# Patient Record
Sex: Female | Born: 1965 | Race: White | Hispanic: No | Marital: Married | Smoking: Never smoker
Health system: Southern US, Community
[De-identification: ages and names within clinical notes are randomized; demographics above are authoritative.]

## PROBLEM LIST (undated history)

## (undated) DIAGNOSIS — D649 Anemia, unspecified: Secondary | ICD-10-CM

## (undated) DIAGNOSIS — G4700 Insomnia, unspecified: Secondary | ICD-10-CM

## (undated) DIAGNOSIS — I10 Essential (primary) hypertension: Secondary | ICD-10-CM

## (undated) HISTORY — DX: Insomnia, unspecified: G47.00

## (undated) HISTORY — PX: GUM SURGERY: SHX658

## (undated) HISTORY — PX: WISDOM TOOTH EXTRACTION: SHX21

## (undated) HISTORY — PX: OTHER SURGICAL HISTORY: SHX169

---

## 1997-07-13 ENCOUNTER — Emergency Department (HOSPITAL_COMMUNITY): Admission: EM | Admit: 1997-07-13 | Discharge: 1997-07-13 | Payer: Self-pay | Admitting: Emergency Medicine

## 1997-09-18 ENCOUNTER — Other Ambulatory Visit: Admission: RE | Admit: 1997-09-18 | Discharge: 1997-09-18 | Payer: Self-pay | Admitting: Obstetrics and Gynecology

## 1997-10-20 ENCOUNTER — Other Ambulatory Visit: Admission: RE | Admit: 1997-10-20 | Discharge: 1997-10-20 | Payer: Self-pay | Admitting: *Deleted

## 1997-11-24 ENCOUNTER — Other Ambulatory Visit: Admission: RE | Admit: 1997-11-24 | Discharge: 1997-11-24 | Payer: Self-pay | Admitting: Obstetrics and Gynecology

## 1998-03-02 ENCOUNTER — Ambulatory Visit (HOSPITAL_COMMUNITY): Admission: RE | Admit: 1998-03-02 | Discharge: 1998-03-02 | Payer: Self-pay | Admitting: Obstetrics and Gynecology

## 1998-03-07 ENCOUNTER — Inpatient Hospital Stay (HOSPITAL_COMMUNITY): Admission: AD | Admit: 1998-03-07 | Discharge: 1998-03-07 | Payer: Self-pay | Admitting: Obstetrics & Gynecology

## 1998-03-11 ENCOUNTER — Inpatient Hospital Stay (HOSPITAL_COMMUNITY): Admission: AD | Admit: 1998-03-11 | Discharge: 1998-03-16 | Payer: Self-pay | Admitting: Obstetrics and Gynecology

## 1998-03-15 ENCOUNTER — Encounter (HOSPITAL_COMMUNITY): Admission: RE | Admit: 1998-03-15 | Discharge: 1998-06-13 | Payer: Self-pay | Admitting: Obstetrics and Gynecology

## 1998-04-22 ENCOUNTER — Other Ambulatory Visit: Admission: RE | Admit: 1998-04-22 | Discharge: 1998-04-22 | Payer: Self-pay | Admitting: Obstetrics and Gynecology

## 1998-06-22 ENCOUNTER — Encounter (HOSPITAL_COMMUNITY): Admission: RE | Admit: 1998-06-22 | Discharge: 1998-09-20 | Payer: Self-pay | Admitting: Obstetrics and Gynecology

## 1999-10-14 ENCOUNTER — Other Ambulatory Visit: Admission: RE | Admit: 1999-10-14 | Discharge: 1999-10-14 | Payer: Self-pay | Admitting: Obstetrics and Gynecology

## 2001-03-07 ENCOUNTER — Other Ambulatory Visit: Admission: RE | Admit: 2001-03-07 | Discharge: 2001-03-07 | Payer: Self-pay | Admitting: Obstetrics and Gynecology

## 2003-05-30 ENCOUNTER — Other Ambulatory Visit: Admission: RE | Admit: 2003-05-30 | Discharge: 2003-05-30 | Payer: Self-pay | Admitting: Obstetrics and Gynecology

## 2003-09-10 ENCOUNTER — Ambulatory Visit (HOSPITAL_COMMUNITY): Admission: RE | Admit: 2003-09-10 | Discharge: 2003-09-10 | Payer: Self-pay | Admitting: Internal Medicine

## 2003-10-17 ENCOUNTER — Ambulatory Visit (HOSPITAL_COMMUNITY): Admission: RE | Admit: 2003-10-17 | Discharge: 2003-10-17 | Payer: Self-pay | Admitting: General Surgery

## 2003-10-27 ENCOUNTER — Ambulatory Visit (HOSPITAL_COMMUNITY): Admission: RE | Admit: 2003-10-27 | Discharge: 2003-10-27 | Payer: Self-pay | Admitting: General Surgery

## 2004-04-05 ENCOUNTER — Encounter: Admission: RE | Admit: 2004-04-05 | Discharge: 2004-04-05 | Payer: Self-pay | Admitting: General Surgery

## 2004-06-22 ENCOUNTER — Encounter: Admission: RE | Admit: 2004-06-22 | Discharge: 2004-06-22 | Payer: Self-pay | Admitting: General Surgery

## 2010-01-31 ENCOUNTER — Encounter: Payer: Self-pay | Admitting: General Surgery

## 2013-09-26 ENCOUNTER — Other Ambulatory Visit: Payer: Self-pay | Admitting: Gastroenterology

## 2013-10-14 ENCOUNTER — Other Ambulatory Visit: Payer: Self-pay | Admitting: Obstetrics and Gynecology

## 2013-10-15 LAB — CYTOLOGY - PAP

## 2013-10-16 NOTE — Patient Instructions (Addendum)
   Your procedure is scheduled on:  Monday, Oct 12  Enter through the [employer] [facility] at: [place] up the phone at the desk and dial [phone] and inform us of your arrival.  Please call this number if you have any problems the morning of surgery: [phone]  Remember: Do not eat food after midnight: Sunday Do not drink clear liquids after: 9 AM Monday, day of surgery Take these medicines the morning of surgery with a SIP OF WATER:  Benicar  Do not wear jewelry, make-up, or FINGER nail polish No metal in your hair or on your body. Do not wear lotions, powders, perfumes.  You may wear deodorant.  Do not bring valuables to the hospital. Contacts, dentures or bridgework may not be worn into surgery.  Patients discharged on the day of surgery will not be allowed to drive home.  Home with husband Sonia Side cell [phone]

## 2013-10-17 ENCOUNTER — Encounter (HOSPITAL_COMMUNITY)
Admission: RE | Admit: 2013-10-17 | Discharge: 2013-10-17 | Disposition: A | Discharge status: Home / Self Care | Payer: BC Managed Care – PPO | Source: Ambulatory Visit | Attending: Obstetrics and Gynecology | Admitting: Obstetrics and Gynecology

## 2013-10-17 ENCOUNTER — Encounter (HOSPITAL_COMMUNITY): Payer: Self-pay

## 2013-10-17 DIAGNOSIS — N920 Excessive and frequent menstruation with regular cycle: Secondary | ICD-10-CM | POA: Insufficient documentation

## 2013-10-17 DIAGNOSIS — I10 Essential (primary) hypertension: Secondary | ICD-10-CM | POA: Diagnosis not present

## 2013-10-17 DIAGNOSIS — Z0000 Encounter for general adult medical examination without abnormal findings: Secondary | ICD-10-CM | POA: Diagnosis present

## 2013-10-17 DIAGNOSIS — D649 Anemia, unspecified: Secondary | ICD-10-CM | POA: Insufficient documentation

## 2013-10-17 DIAGNOSIS — N946 Dysmenorrhea, unspecified: Secondary | ICD-10-CM | POA: Diagnosis not present

## 2013-10-17 HISTORY — DX: Anemia, unspecified: D64.9

## 2013-10-17 HISTORY — DX: Essential (primary) hypertension: I10

## 2013-10-17 LAB — COMPREHENSIVE METABOLIC PANEL
ALT: 13 U/L (ref 0–35)
AST: 13 U/L (ref 0–37)
Albumin: 4.1 g/dL (ref 3.5–5.2)
Alkaline Phosphatase: 68 U/L (ref 39–117)
Anion gap: 10 (ref 5–15)
BUN: 20 mg/dL (ref 6–23)
CO2: 24 mEq/L (ref 19–32)
Calcium: 9 mg/dL (ref 8.4–10.5)
Chloride: 103 mEq/L (ref 96–112)
Creatinine, Ser: 0.93 mg/dL (ref 0.50–1.10)
GFR calc Af Amer: 82 mL/min — ABNORMAL LOW (ref >90)
GFR calc non Af Amer: 70 mL/min — ABNORMAL LOW (ref >90)
Glucose, Bld: 86 mg/dL (ref 70–99)
Potassium: 4.8 mEq/L (ref 3.7–5.3)
Sodium: 137 mEq/L (ref 137–147)
Total Bilirubin: 0.6 mg/dL (ref 0.3–1.2)
Total Protein: 7.5 g/dL (ref 6.0–8.3)

## 2013-10-17 LAB — CBC
HCT: 32.1 % — ABNORMAL LOW (ref 36.0–46.0)
Hemoglobin: 9.9 g/dL — ABNORMAL LOW (ref 12.0–15.0)
MCH: 23.5 pg — ABNORMAL LOW (ref 26.0–34.0)
MCHC: 30.8 g/dL (ref 30.0–36.0)
MCV: 76.1 fL — ABNORMAL LOW (ref 78.0–100.0)
Platelets: 329 10*3/uL (ref 150–400)
RBC: 4.22 MIL/uL (ref 3.87–5.11)
RDW: 17.3 % — ABNORMAL HIGH (ref 11.5–15.5)
WBC: 5.6 10*3/uL (ref 4.0–10.5)

## 2013-10-17 NOTE — Progress Notes (Signed)
[facility]  DICTATION # [id number] CSN# [id number]   Margarette Asal, MD 10/17/2013 11:24 AM

## 2013-10-18 NOTE — H&P (Signed)
Adrienne Hayes, BOCH                  ACCOUNT NO.:  [id number]  MEDICAL RECORD NO.:  [id number]  LOCATION:                                 FACILITY:  PHYSICIAN:  Ralene Bathe. Matthew Saras, M.D.    DATE OF BIRTH:  DATE OF ADMISSION:  10/21/2013 DATE OF DISCHARGE:                             HISTORY & PHYSICAL   CHIEF COMPLAINT:  Dysmenorrhea, menorrhagia with anemia.  HISTORY OF PRESENT ILLNESS:  A 48 year old G1, P1 who has problems with continued menorrhagia and dysmenorrhea along with fatigue.  Several years ago when she had similar complaints, we performed sonohysterogram in the office that was dated 12/13.  At that time, adnexa were unremarkable, cul-de-sac was normal.  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  On saline infusion, there appeared to be a polyp in the right cavity with the left side showing some irregularity.  We had recommended D and C, hysteroscopy at that time.  Due to insurance issues, she declined.  She now has insurance coverage.  These symptoms have persisted and she presents now for D and C, hysteroscopy with Truclear resection of the polyp.  This procedure including specific risks related to bleeding, infection, other complications such as perforation, and may require additional surgery reviewed with her which she understands and accepts.  PAST MEDICAL HISTORY: 1. Allergies:  CT dye. 2. Surgeries:  She has had 1 C-section in 2000.  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  She is currently on Benicar.  Dr. Bevelyn Buckles is her medical doctor.  FAMILY HISTORY:  Significant for headache, heart disease, ulcer disease, TB, kidney disease, UTI, osteoporosis, diverticulosis, arthritis, phlebitis, hypertension, and melanoma.  Last Pap in October 2015 returned normal.  PHYSICAL EXAMINATION:  VITAL SIGNS:  Temp 98.2, blood pressure 120/78. HEENT:  Unremarkable. NECK:  Supple, without masses. LUNGS:  Clear. CARDIOVASCULAR:  Regular rate and  rhythm without murmurs, rubs, gallops noted. BREASTS:  Without masses. ABDOMEN:  Soft, flat, and nontender. PELVIC:  Vulva, vagina, cervix normal.  Uterus was upper limit of normal size.  Adnexa negative. EXTREMITIES:  Unremarkable. NEUROLOGIC:  Unremarkable.  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  PLAN:  D and C, hysteroscopy with Truclear resection.  Procedure and risks discussed as above.     Quintana Canelo M. Matthew Saras, M.D.     [facility]MED[facility]  D:  10/17/2013  T:  10/17/2013  Job:  [id number]

## 2013-10-21 ENCOUNTER — Ambulatory Visit (HOSPITAL_COMMUNITY): Payer: BC Managed Care – PPO | Admitting: Anesthesiology

## 2013-10-21 ENCOUNTER — Encounter (HOSPITAL_COMMUNITY): Payer: BC Managed Care – PPO | Admitting: Anesthesiology

## 2013-10-21 ENCOUNTER — Ambulatory Visit (HOSPITAL_COMMUNITY)
Admission: RE | Admit: 2013-10-21 | Discharge: 2013-10-21 | Disposition: A | Discharge status: Home / Self Care | Payer: BC Managed Care – PPO | Source: Ambulatory Visit | Attending: Obstetrics and Gynecology | Admitting: Obstetrics and Gynecology

## 2013-10-21 ENCOUNTER — Encounter (HOSPITAL_COMMUNITY): Payer: Self-pay | Admitting: Anesthesiology

## 2013-10-21 ENCOUNTER — Encounter (HOSPITAL_COMMUNITY)
Admission: RE | Disposition: A | Discharge status: Home / Self Care | Payer: Self-pay | Source: Ambulatory Visit | Attending: Obstetrics and Gynecology

## 2013-10-21 DIAGNOSIS — D649 Anemia, unspecified: Secondary | ICD-10-CM | POA: Diagnosis not present

## 2013-10-21 DIAGNOSIS — N840 Polyp of corpus uteri: Secondary | ICD-10-CM | POA: Insufficient documentation

## 2013-10-21 DIAGNOSIS — N946 Dysmenorrhea, unspecified: Secondary | ICD-10-CM | POA: Diagnosis present

## 2013-10-21 DIAGNOSIS — Q51818 Other congenital malformations of uterus: Secondary | ICD-10-CM | POA: Insufficient documentation

## 2013-10-21 DIAGNOSIS — D259 Leiomyoma of uterus, unspecified: Secondary | ICD-10-CM | POA: Diagnosis not present

## 2013-10-21 DIAGNOSIS — I10 Essential (primary) hypertension: Secondary | ICD-10-CM | POA: Insufficient documentation

## 2013-10-21 HISTORY — PX: DILATATION & CURETTAGE/HYSTEROSCOPY WITH TRUECLEAR: SHX6353

## 2013-10-21 LAB — PREGNANCY, URINE: Preg Test, Ur: NEGATIVE

## 2013-10-21 SURGERY — DILATATION & CURETTAGE/HYSTEROSCOPY WITH TRUCLEAR
Anesthesia: General

## 2013-10-21 MED ORDER — SCOPOLAMINE 1 MG/3DAYS TD PT72
1.0000 | MEDICATED_PATCH | Freq: Once | TRANSDERMAL | Status: DC
  Administered 2013-10-21: 1.5 mg via TRANSDERMAL

## 2013-10-21 MED ORDER — FENTANYL CITRATE 0.05 MG/ML IJ SOLN
INTRAMUSCULAR | Status: DC | PRN
  Administered 2013-10-21 (×2): 50 ug via INTRAVENOUS

## 2013-10-21 MED ORDER — MIDAZOLAM HCL 2 MG/2ML IJ SOLN
INTRAMUSCULAR | Status: AC
  Filled 2013-10-21: qty 2

## 2013-10-21 MED ORDER — PHENYLEPHRINE 40 MCG/ML (10ML) SYRINGE FOR IV PUSH (FOR BLOOD PRESSURE SUPPORT)
PREFILLED_SYRINGE | INTRAVENOUS | Status: AC
  Filled 2013-10-21: qty 5

## 2013-10-21 MED ORDER — LIDOCAINE HCL (CARDIAC) 20 MG/ML IV SOLN
INTRAVENOUS | Status: AC
  Filled 2013-10-21: qty 5

## 2013-10-21 MED ORDER — PROPOFOL 10 MG/ML IV BOLUS
INTRAVENOUS | Status: DC | PRN
  Administered 2013-10-21: 150 mg via INTRAVENOUS

## 2013-10-21 MED ORDER — IBUPROFEN 200 MG PO TABS: 800.0000 mg | ORAL_TABLET | Freq: Three times a day (TID) | ORAL | Status: DC | PRN

## 2013-10-21 MED ORDER — KETOROLAC TROMETHAMINE 30 MG/ML IJ SOLN: 15.0000 mg | Freq: Once | INTRAMUSCULAR | Status: DC | PRN

## 2013-10-21 MED ORDER — KETOROLAC TROMETHAMINE 30 MG/ML IJ SOLN
INTRAMUSCULAR | Status: DC | PRN
  Administered 2013-10-21: 30 mg via INTRAVENOUS

## 2013-10-21 MED ORDER — OXYCODONE-ACETAMINOPHEN 2.5-325 MG PO TABS: 1.0000 | ORAL_TABLET | ORAL | Status: DC | PRN

## 2013-10-21 MED ORDER — SCOPOLAMINE 1 MG/3DAYS TD PT72
MEDICATED_PATCH | TRANSDERMAL | Status: AC
  Filled 2013-10-21: qty 1

## 2013-10-21 MED ORDER — DEXAMETHASONE SODIUM PHOSPHATE 10 MG/ML IJ SOLN
INTRAMUSCULAR | Status: DC | PRN
  Administered 2013-10-21: 4 mg via INTRAVENOUS

## 2013-10-21 MED ORDER — PROMETHAZINE HCL 25 MG/ML IJ SOLN: 6.2500 mg | INTRAMUSCULAR | Status: DC | PRN

## 2013-10-21 MED ORDER — PHENYLEPHRINE HCL 10 MG/ML IJ SOLN
INTRAMUSCULAR | Status: DC | PRN
  Administered 2013-10-21: 80 ug via INTRAVENOUS
  Administered 2013-10-21: 40 ug via INTRAVENOUS
  Administered 2013-10-21 (×2): 80 ug via INTRAVENOUS

## 2013-10-21 MED ORDER — FENTANYL CITRATE 0.05 MG/ML IJ SOLN
INTRAMUSCULAR | Status: AC
  Filled 2013-10-21: qty 2

## 2013-10-21 MED ORDER — DEXAMETHASONE SODIUM PHOSPHATE 4 MG/ML IJ SOLN
INTRAMUSCULAR | Status: AC
  Filled 2013-10-21: qty 1

## 2013-10-21 MED ORDER — ONDANSETRON HCL 4 MG/2ML IJ SOLN
INTRAMUSCULAR | Status: DC | PRN
  Administered 2013-10-21: 4 mg via INTRAVENOUS

## 2013-10-21 MED ORDER — MIDAZOLAM HCL 2 MG/2ML IJ SOLN
INTRAMUSCULAR | Status: DC | PRN
  Administered 2013-10-21: 2 mg via INTRAVENOUS

## 2013-10-21 MED ORDER — LIDOCAINE HCL 1 % IJ SOLN
INTRAMUSCULAR | Status: DC | PRN
  Administered 2013-10-21: 8.5 mL

## 2013-10-21 MED ORDER — ONDANSETRON HCL 4 MG/2ML IJ SOLN
INTRAMUSCULAR | Status: AC
  Filled 2013-10-21: qty 2

## 2013-10-21 MED ORDER — PROPOFOL 10 MG/ML IV EMUL
INTRAVENOUS | Status: AC
  Filled 2013-10-21: qty 20

## 2013-10-21 MED ORDER — KETOROLAC TROMETHAMINE 30 MG/ML IJ SOLN
INTRAMUSCULAR | Status: AC
  Filled 2013-10-21: qty 1

## 2013-10-21 MED ORDER — SODIUM CHLORIDE 0.9 % IR SOLN
Status: DC | PRN
Start: 2013-10-21 — End: 2013-10-21
  Administered 2013-10-21: 3000 mL

## 2013-10-21 MED ORDER — FENTANYL CITRATE 0.05 MG/ML IJ SOLN
25.0000 ug | INTRAMUSCULAR | Status: DC | PRN
  Administered 2013-10-21: 50 ug via INTRAVENOUS

## 2013-10-21 MED ORDER — LIDOCAINE HCL (CARDIAC) 20 MG/ML IV SOLN
INTRAVENOUS | Status: DC | PRN
  Administered 2013-10-21: 50 mg via INTRAVENOUS

## 2013-10-21 MED ORDER — MIDAZOLAM HCL 2 MG/2ML IJ SOLN: 0.5000 mg | Freq: Once | INTRAMUSCULAR | Status: DC | PRN

## 2013-10-21 MED ORDER — MEPERIDINE HCL 25 MG/ML IJ SOLN: 6.2500 mg | INTRAMUSCULAR | Status: DC | PRN

## 2013-10-21 MED ORDER — LACTATED RINGERS IV SOLN
INTRAVENOUS | Status: DC
  Administered 2013-10-21 (×2): via INTRAVENOUS

## 2013-10-21 MED ORDER — LIDOCAINE HCL 1 % IJ SOLN
INTRAMUSCULAR | Status: AC
  Filled 2013-10-21: qty 20

## 2013-10-21 SURGICAL SUPPLY — 15 items
BLADE INCISOR TRUC PLUS 2.9 (ABLATOR) ×1 IMPLANT
CANISTERS HI-FLOW 3000CC (CANNISTER) ×6 IMPLANT
CATH ROBINSON RED A/P 16FR (CATHETERS) ×3 IMPLANT
CLOTH BEACON ORANGE TIMEOUT ST (SAFETY) ×3 IMPLANT
CONTAINER PREFILL 10% NBF 60ML (FORM) ×3 IMPLANT
DRAPE HYSTEROSCOPY (DRAPE) ×3 IMPLANT
GLOVE BIO SURGEON STRL SZ7 (GLOVE) ×6 IMPLANT
GOWN STRL REUS W/TWL LRG LVL3 (GOWN DISPOSABLE) ×6 IMPLANT
INCISOR TRUC PLUS BLADE 2.9 (ABLATOR) ×3
KIT HYSTEROSCOPY TRUCLEAR (ABLATOR) IMPLANT
MORCELLATOR RECIP TRUCLEAR 4.0 (ABLATOR) IMPLANT
PACK VAGINAL MINOR WOMEN LF (CUSTOM PROCEDURE TRAY) ×3 IMPLANT
PAD OB MATERNITY 4.3X12.25 (PERSONAL CARE ITEMS) ×3 IMPLANT
TOWEL OR 17X24 6PK STRL BLUE (TOWEL DISPOSABLE) ×6 IMPLANT
WATER STERILE IRR 1000ML POUR (IV SOLUTION) ×3 IMPLANT

## 2013-10-21 NOTE — Transfer of Care (Signed)
Immediate Anesthesia Transfer of Care Note  Patient: Adrienne Hayes  Procedure(s) Performed: Procedure(s): DILATATION & CURETTAGE/HYSTEROSCOPY WITH TRUCLEAR (N/A)  Patient Location: PACU  Anesthesia Type:General  Level of Consciousness: awake, alert  and oriented  Airway & Oxygen Therapy: Patient Spontanous Breathing and Patient connected to nasal cannula oxygen  Post-op Assessment: Report given to PACU RN and Post -op Vital signs reviewed and stable  Post vital signs: Reviewed and stable  Complications: No apparent anesthesia complications

## 2013-10-21 NOTE — Op Note (Signed)
Preoperative diagnosis: Abnormal uterine bleeding, endometrial polyp, congenital uterine malformation, communicating rudimentary left uterine horn  Postoperative diagnosis: Same  Procedure: D&C hysteroscopy with true clear resection of endometrial polyp, right uterine horn  Surgeon: Matthew Saras A Anesthesia: Gen.  Specimens removed:, Endometrial curettings, resected polyp fibroids, all to pathology.    Complications: None  EBL: Less than 50 cc:  The patient taken the operating room after an adequate level of general anesthesia was obtained with the legs in stirrups the perineum and vagina prepped and draped in the usual fashion for D&C, bladder was drained, EUA was carried out uterus was upper limit normal size mid position, adnexa negative. Appropriate timeout taken at that point.  Speculum was positioned, cervix grasped with tenaculum paracervical block was then created by infiltrating at 3 and 9:00 submucosally 5-7 cc of 1% plain Xylocaine at each site after negative aspiration. The uterus is then sounded to 9 cm, progressively dilated to a 27 Pratt dilator, continuous flow hysteroscope was inserted and a communicating rudimentary right are left uterine horn could be seen although the opening was small. The main cavity was on the right scope was inserted into the fundus there were several polypoid areas were noted these were resected with the true clear resector down to the level of the surrounding endometrium. Once this was completed, the scope was removed sharp curettage was carried out minimal tissue removed, the scope was reinserted the cavity was noted to be clean. She tolerated this well went to recovery room in good condition.  Dictated with [facility]  Adrienne Hayes M. Garry Heater.D.

## 2013-10-21 NOTE — Discharge Instructions (Signed)
DISCHARGE INSTRUCTIONS: HYSTEROSCOPY / ENDOMETRIAL ABLATION °The following instructions have been prepared to help you care for yourself upon your return home. ° °Personal hygiene: °• Use sanitary pads for vaginal drainage, not tampons. °• Shower the day after your procedure. °• NO tub baths, pools or Jacuzzis for 2-3 weeks. °• Wipe front to back after using the bathroom. ° °Activity and limitations: °• Do NOT drive or operate any equipment for 24 hours. The effects of anesthesia are still present °and drowsiness may result. °• Do NOT rest in bed all day. °• Walking is encouraged. °• Walk up and down stairs slowly. °• You may resume your normal activity in one to two days or as indicated by your physician. °Sexual activity: NO intercourse for at least 2 weeks after the procedure, or as indicated by your °Doctor. ° °Diet: Eat a light meal as desired this evening. You may resume your usual diet tomorrow. ° °Return to Work: You may resume your work activities in one to two days or as indicated by your °Doctor. ° °What to expect after your surgery: Expect to have vaginal bleeding/discharge for 2-3 days and °spotting for up to 10 days. It is not unusual to have soreness for up to 1-2 weeks. You may have a °slight burning sensation when you urinate for the first day. Mild cramps may continue for a couple of °days. You may have a regular period in 2-6 weeks. ° °NO IBUPROFEN PRODUCTS (MOTRIN, ADVIL) OR ALEVE UNTIL 7:30PM TODAY. ° ° °Call your doctor for any of the following: °• Excessive vaginal bleeding or clotting, saturating and changing one pad every hour. °• Inability to urinate 6 hours after discharge from hospital. °• Pain not relieved by pain medication. °• Fever of 100.4° F or greater. °• Unusual vaginal discharge or odor. ° °Return to office _________________Call for an appointment ___________________ °Patient’s signature: ______________________ °Nurse’s signature ________________________ ° °Post Anesthesia Care  Unit [phone] °

## 2013-10-21 NOTE — Anesthesia Preprocedure Evaluation (Signed)
Anesthesia Evaluation  Patient identified by MRN, date of birth, ID band Patient awake    Reviewed: Allergy & Precautions, H&P , Patient's Chart, lab work & pertinent test results, reviewed documented beta blocker date and time   History of Anesthesia Complications Negative for: history of anesthetic complications  Airway Mallampati: II TM Distance: >3 FB Neck ROM: full    Dental   Pulmonary  breath sounds clear to auscultation        Cardiovascular Exercise Tolerance: Good hypertension, Rhythm:regular Rate:Normal     Neuro/Psych negative psych ROS   GI/Hepatic   Endo/Other    Renal/GU      Musculoskeletal   Abdominal   Peds  Hematology  (+) anemia ,   Anesthesia Other Findings   Reproductive/Obstetrics                           Anesthesia Physical Anesthesia Plan  ASA: II  Anesthesia Plan: General LMA   Post-op Pain Management:    Induction:   Airway Management Planned:   Additional Equipment:   Intra-op Plan:   Post-operative Plan:   Informed Consent: I have reviewed the patients History and Physical, chart, labs and discussed the procedure including the risks, benefits and alternatives for the proposed anesthesia with the patient or authorized representative who has indicated his/her understanding and acceptance.   Dental Advisory Given  Plan Discussed with: CRNA, Surgeon and Anesthesiologist  Anesthesia Plan Comments:         Anesthesia Quick Evaluation

## 2013-10-21 NOTE — Anesthesia Postprocedure Evaluation (Signed)
Anesthesia Post Note  Patient: Adrienne Hayes  Procedure(s) Performed: Procedure(s) (LRB): DILATATION & CURETTAGE/HYSTEROSCOPY WITH TRUCLEAR (N/A)  Anesthesia type: GA  Patient location: PACU  Post pain: Pain level controlled  Post assessment: Post-op Vital signs reviewed  Last Vitals:  Filed Vitals:   10/21/13 1347  BP:   Pulse:   Temp: 36.4 C  Resp: 16    Post vital signs: Reviewed  Level of consciousness: sedated  Complications: No apparent anesthesia complications

## 2013-10-21 NOTE — Progress Notes (Signed)
The patient was re-examined with no change in status 

## 2013-10-22 ENCOUNTER — Encounter (HOSPITAL_COMMUNITY): Payer: Self-pay | Admitting: Obstetrics and Gynecology

## 2013-10-25 ENCOUNTER — Other Ambulatory Visit: Payer: Self-pay | Admitting: Obstetrics and Gynecology

## 2013-10-25 DIAGNOSIS — R928 Other abnormal and inconclusive findings on diagnostic imaging of breast: Secondary | ICD-10-CM

## 2013-11-06 ENCOUNTER — Ambulatory Visit
Admission: RE | Admit: 2013-11-06 | Discharge: 2013-11-06 | Disposition: A | Discharge status: Home / Self Care | Payer: BC Managed Care – PPO | Source: Ambulatory Visit | Attending: Obstetrics and Gynecology | Admitting: Obstetrics and Gynecology

## 2013-11-06 ENCOUNTER — Other Ambulatory Visit: Payer: Self-pay | Admitting: Obstetrics and Gynecology

## 2013-11-06 DIAGNOSIS — R928 Other abnormal and inconclusive findings on diagnostic imaging of breast: Secondary | ICD-10-CM

## 2013-11-13 ENCOUNTER — Ambulatory Visit
Admission: RE | Admit: 2013-11-13 | Discharge: 2013-11-13 | Disposition: A | Discharge status: Home / Self Care | Payer: BC Managed Care – PPO | Source: Ambulatory Visit | Attending: Obstetrics and Gynecology | Admitting: Obstetrics and Gynecology

## 2013-11-13 DIAGNOSIS — R928 Other abnormal and inconclusive findings on diagnostic imaging of breast: Secondary | ICD-10-CM

## 2014-01-02 ENCOUNTER — Telehealth: Payer: Self-pay | Admitting: Hematology

## 2014-01-02 NOTE — Telephone Encounter (Signed)
S/W PT IN REF TO NP APPT. ON 01/16/14@2 :30 REFERRING DR SCHOOLER DX- IRON DEFICIENCY ANEMIA

## 2014-01-13 ENCOUNTER — Encounter: Payer: Self-pay | Admitting: Hematology

## 2014-01-13 ENCOUNTER — Telehealth: Payer: Self-pay | Admitting: *Deleted

## 2014-01-13 NOTE — Telephone Encounter (Signed)
Received call from pt stating that she would like to know how imperative it is for her to come to appt 01/16/13 & could it be postponed.  She reports that her mother is with hospice & she has a lot going on right now.  She can be reached at [phone]

## 2014-01-14 ENCOUNTER — Telehealth: Payer: Self-pay | Admitting: Hematology

## 2014-01-14 NOTE — Telephone Encounter (Signed)
left message for patient to return to r/s np appt.

## 2014-01-16 ENCOUNTER — Ambulatory Visit: Payer: BC Managed Care – PPO

## 2014-01-16 ENCOUNTER — Ambulatory Visit: Payer: BC Managed Care – PPO | Admitting: Hematology

## 2014-01-16 ENCOUNTER — Other Ambulatory Visit: Payer: BC Managed Care – PPO

## 2014-02-18 ENCOUNTER — Other Ambulatory Visit: Payer: Self-pay

## 2014-02-18 ENCOUNTER — Ambulatory Visit: Payer: Self-pay

## 2014-02-18 ENCOUNTER — Encounter: Payer: Self-pay | Admitting: Hematology

## 2014-02-22 NOTE — Progress Notes (Signed)
This encounter was created in error - please disregard.

## 2014-11-15 IMAGING — MG MM DIAGNOSTIC UNILATERAL L
2 series · 2 of 2 positions shown · non-contrast
Comparison: Priors

CLINICAL DATA: Screening callback for left upper outer quadrant
calcifications

EXAM:
DIGITAL DIAGNOSTIC  left MAMMOGRAM

[L CC]
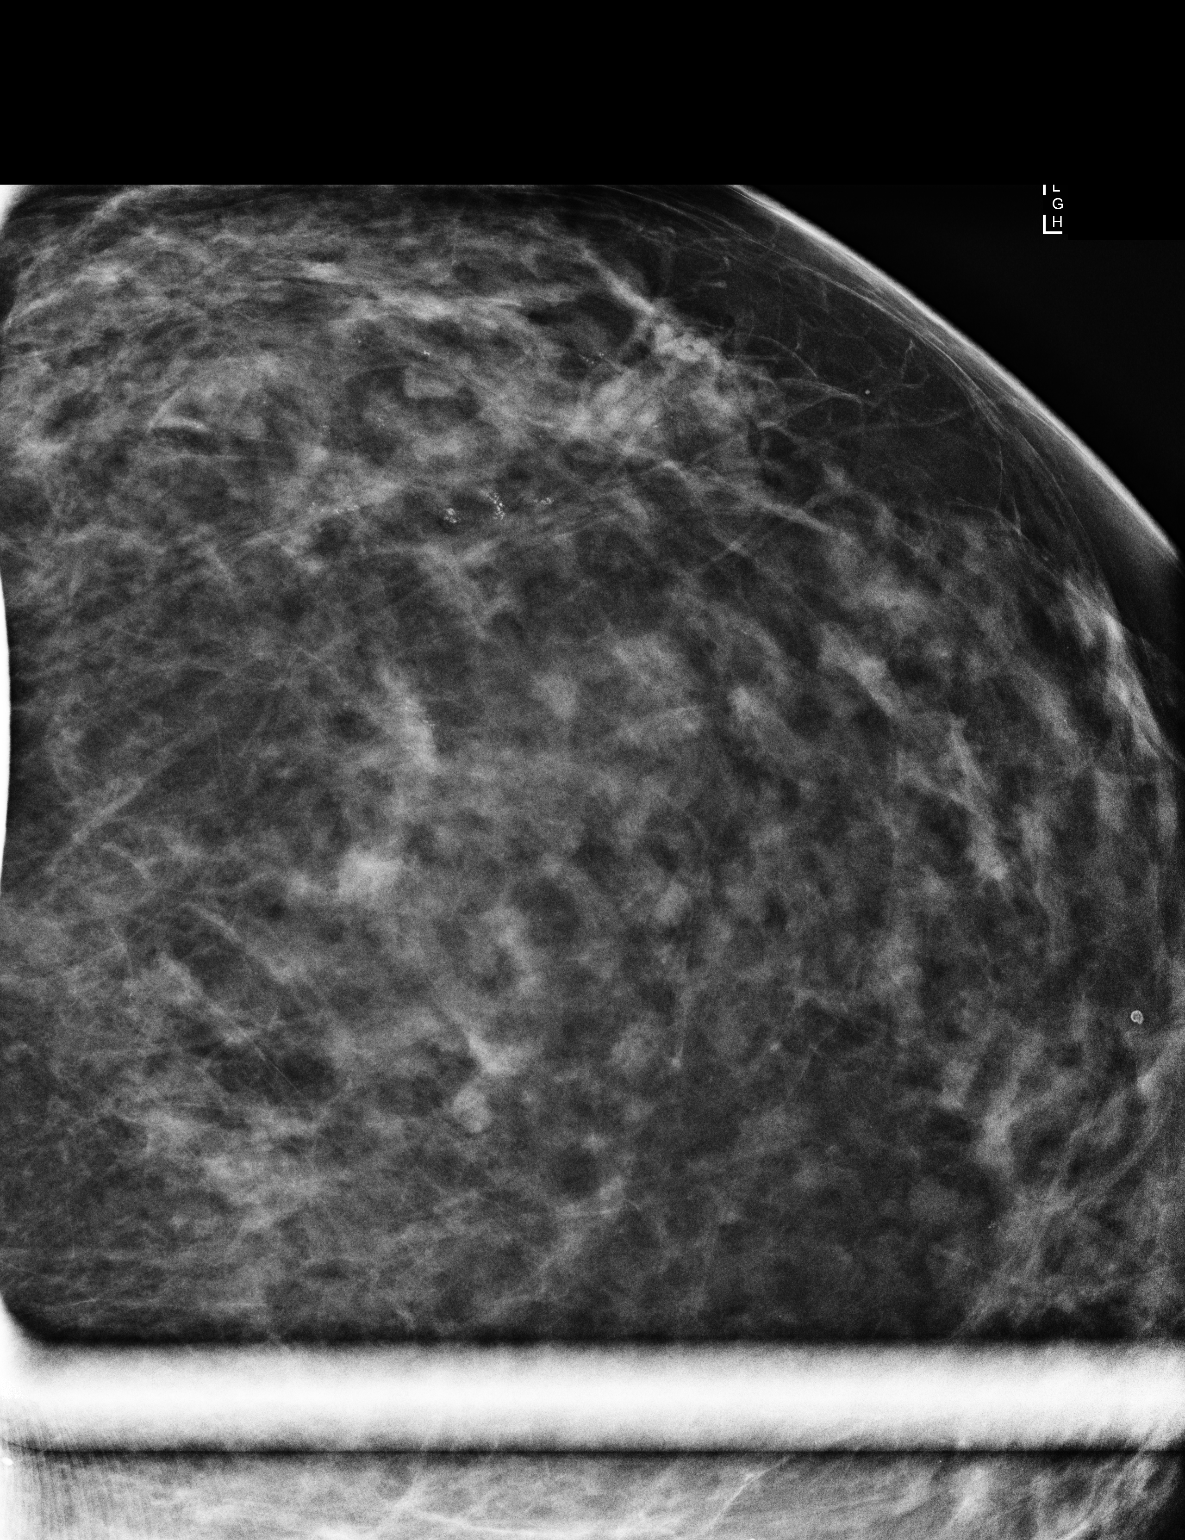

[L ML]
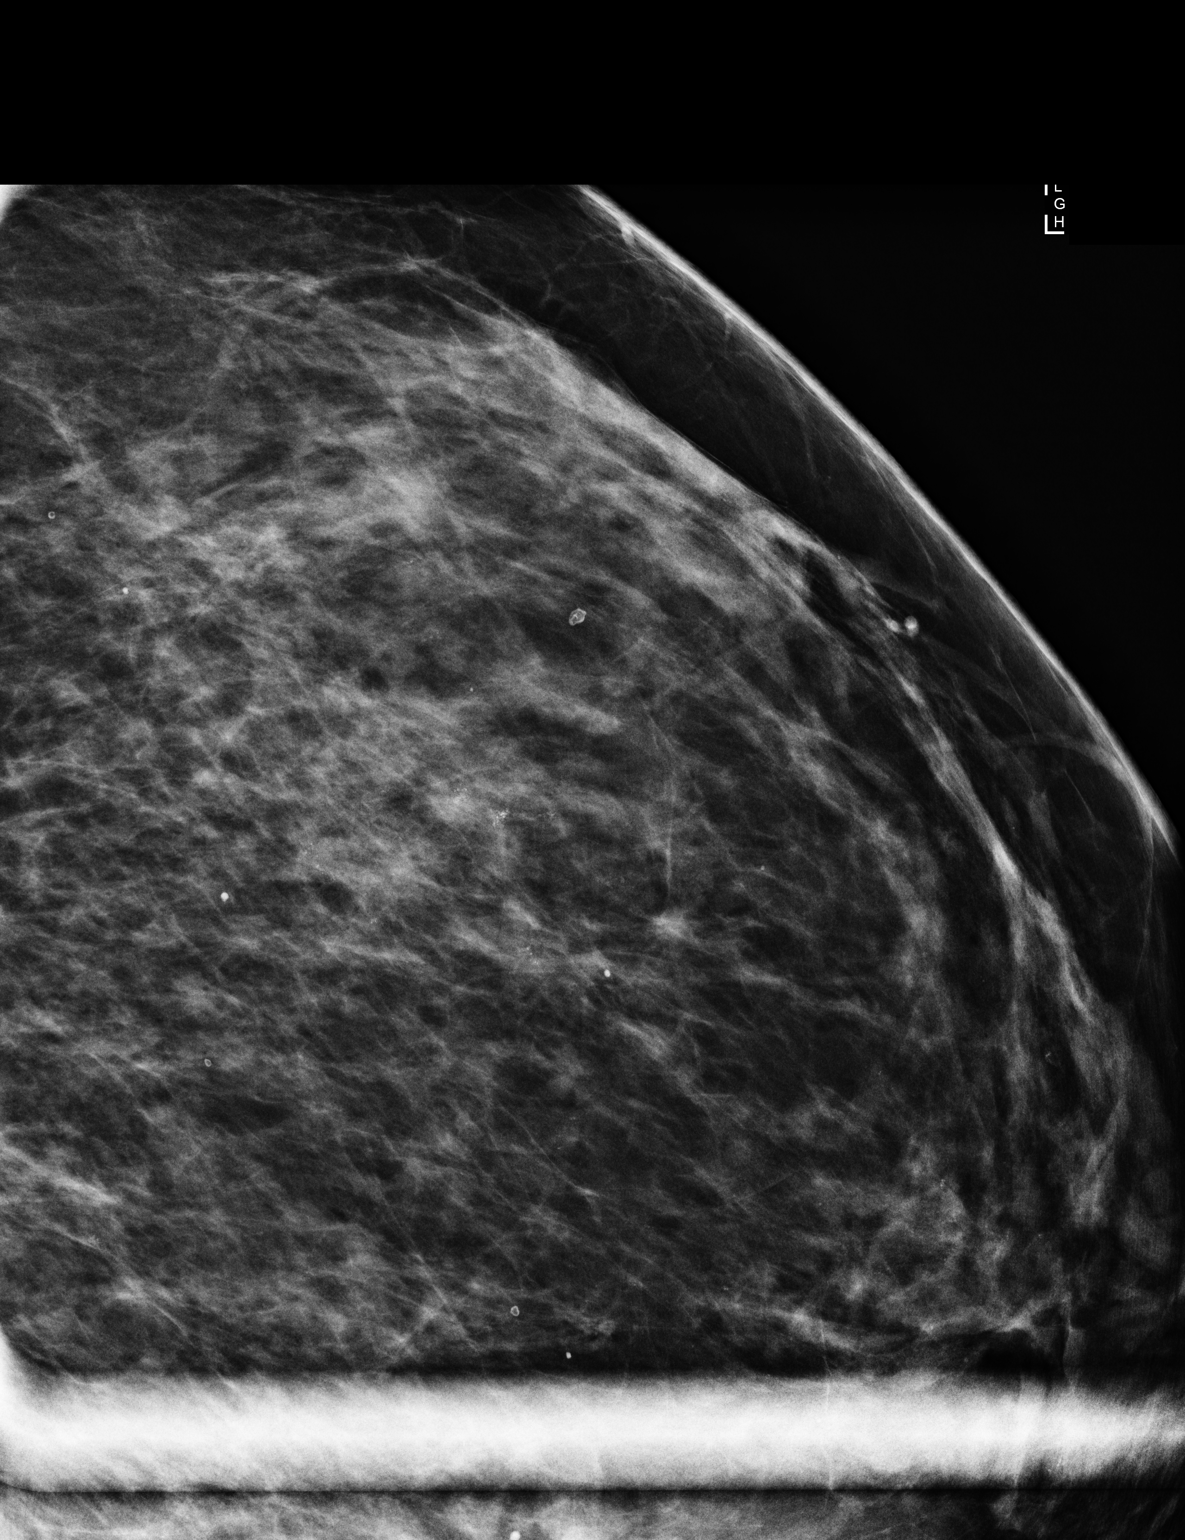

[2 of 2 positions shown; findings below may reference images not displayed]

ACR Breast Density Category c: The breast tissue is heterogeneously
dense, which may obscure small masses.
FINDINGS: Additional views confirm the presence of a 2 cm segmental
distribution of coarse heterogeneous calcifications in the left
upper outer quadrant, corresponding to the screening mammographic
finding.
IMPRESSION: Suspicious left upper outer quadrant calcifications. Stereotactic
core needle biopsy will be scheduled at the patient's convenience.

RECOMMENDATION:
Left stereotactic core needle biopsy

I have discussed the findings and recommendations with the patient.
Results were also provided in writing at the conclusion of the
visit. If applicable, a reminder letter will be sent to the patient
regarding the next appointment.

BI-RADS CATEGORY  4: Suspicious.

## 2014-11-22 IMAGING — MG MM BREAST BX W LOC DEV 1ST LESION IMG BX SPEC STEREO GUIDE L
3 series · 3 of 3 positions shown · non-contrast
Comparison: Previous exams.

ADDENDUM:
Pathology revealed fibrocystic changes with calcifications in the
left breast. This was found to be concordant by Dr. Mousstapha Elf.
Pathology was discussed with the patient by telephone. She reported
doing well after the biopsy with minimal tenderness at the site.
Post biopsy instructions were reviewed and her questions were
answered. She was encouraged to call [REDACTED] for any additional concerns. She was asked to return to

Pathology results reported by Hayron Tiziana RN, BSN on November 14, 2013.
CLINICAL DATA: Coarse calcifications in a segmental distribution in
the upper outer left breast at recent mammography.
EXAM:
LEFT BREAST STEREOTACTIC CORE NEEDLE BIOPSY

[L CC]
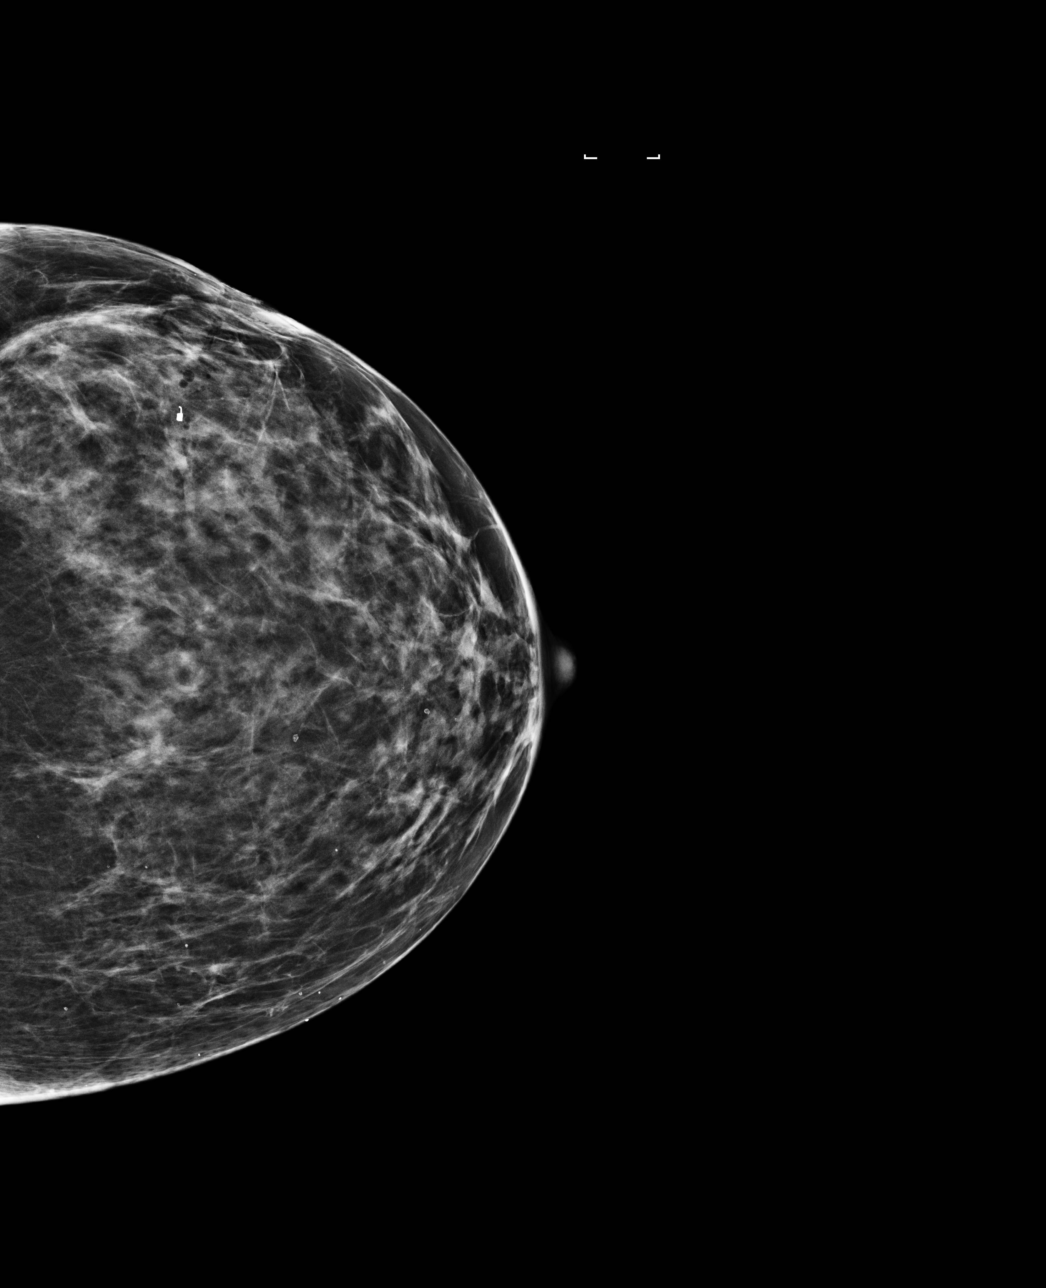

[L SPECIMEN]
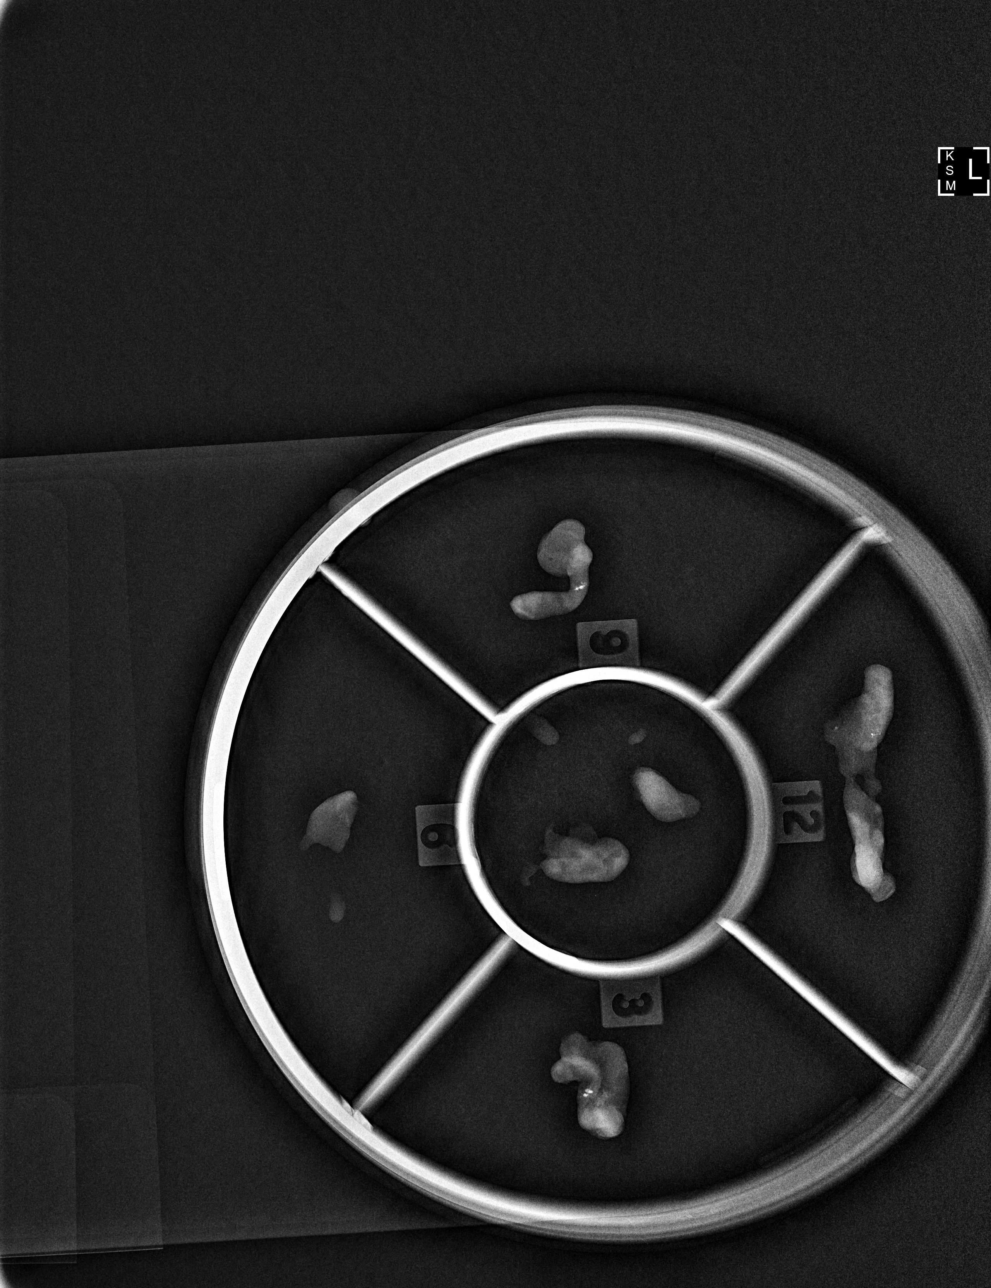

[L ML]
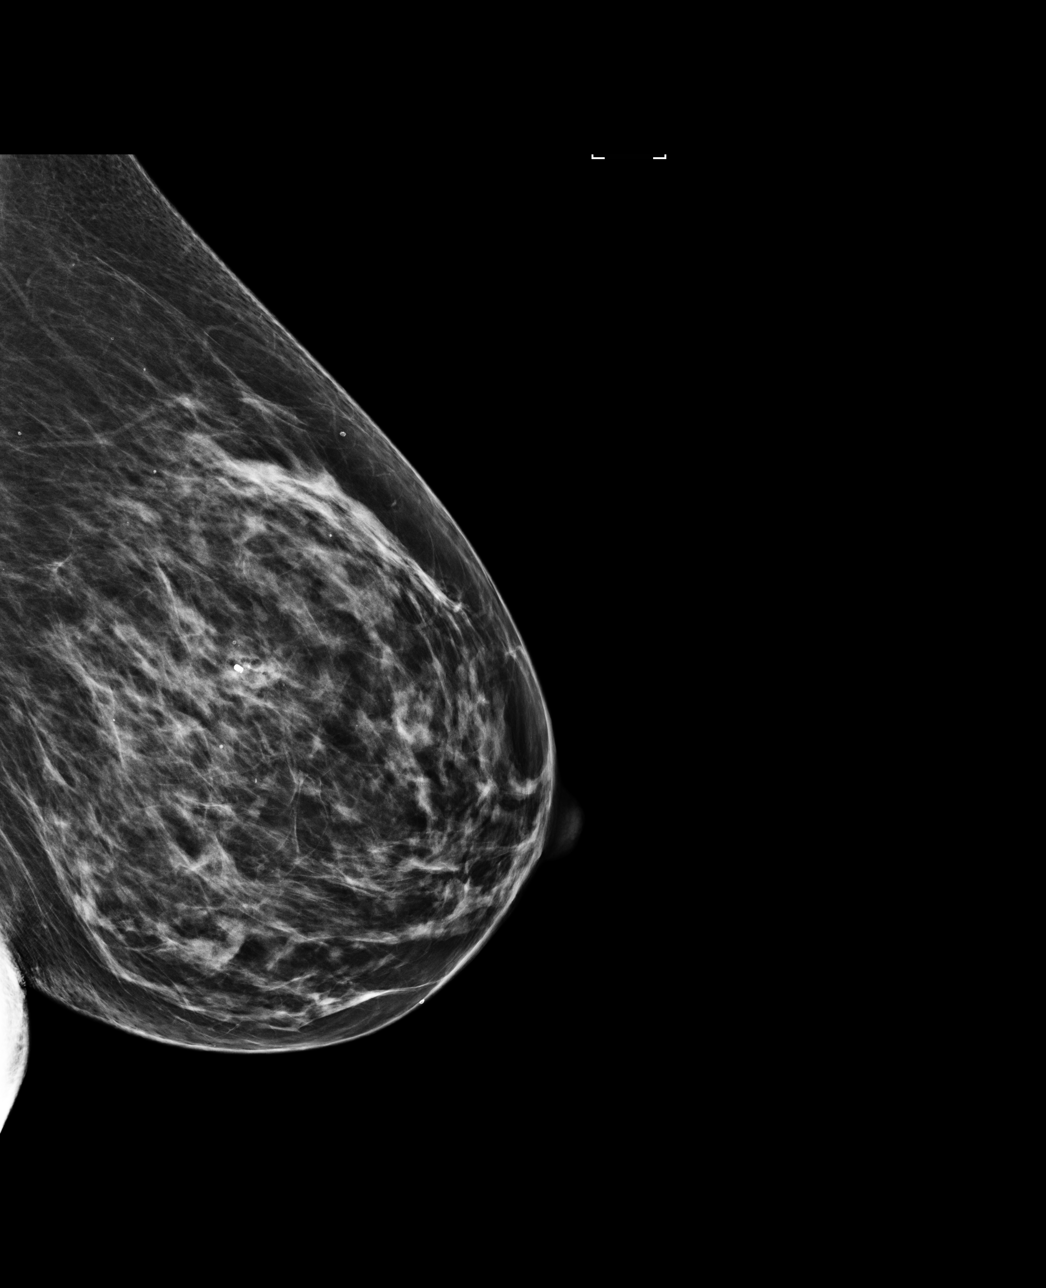

[3 of 3 positions shown; findings below may reference images not displayed]



Using sterile technique and 2% Lidocaine as local anesthetic, under
stereotactic guidance, a 9 gauge vacuum assisted device was used to
perform core needle biopsy of the recently demonstrated
indeterminate microcalcifications in the upper-outer quadrant of the
left breast using a lateral approach. A 2 mm group of calcifications
within this area was targeted for biopsy. Specimen radiograph was
performed showing multiple calcifications within multiple specimens.
Specimens with calcifications are identified for pathology.

At the conclusion of the procedure, a coil shaped tissue marker clip
was deployed into the biopsy cavity. Follow-up 2-view mammogram
confirmed clip deployment at the location of the biopsied
calcifications. No residual targeted calcifications are seen at that
location.
IMPRESSION: Stereotactic-guided biopsy of indeterminate calcifications in the
upper-outer quadrant of the left breast. No apparent complications.

## 2016-05-12 ENCOUNTER — Ambulatory Visit: Payer: Self-pay | Admitting: Podiatry

## 2016-05-26 DIAGNOSIS — Z0000 Encounter for general adult medical examination without abnormal findings: Secondary | ICD-10-CM | POA: Diagnosis not present

## 2016-06-09 DIAGNOSIS — E784 Other hyperlipidemia: Secondary | ICD-10-CM | POA: Diagnosis not present

## 2016-06-09 DIAGNOSIS — D508 Other iron deficiency anemias: Secondary | ICD-10-CM | POA: Diagnosis not present

## 2016-06-09 DIAGNOSIS — Z1389 Encounter for screening for other disorder: Secondary | ICD-10-CM | POA: Diagnosis not present

## 2016-06-09 DIAGNOSIS — Z0000 Encounter for general adult medical examination without abnormal findings: Secondary | ICD-10-CM | POA: Diagnosis not present

## 2017-08-04 DIAGNOSIS — Z1389 Encounter for screening for other disorder: Secondary | ICD-10-CM | POA: Diagnosis not present

## 2017-08-04 DIAGNOSIS — G4709 Other insomnia: Secondary | ICD-10-CM | POA: Diagnosis not present

## 2017-08-04 DIAGNOSIS — I10 Essential (primary) hypertension: Secondary | ICD-10-CM | POA: Diagnosis not present

## 2017-10-02 ENCOUNTER — Institutional Professional Consult (permissible substitution): Payer: Self-pay | Admitting: Neurology

## 2017-11-09 ENCOUNTER — Encounter: Payer: Self-pay | Admitting: Neurology

## 2017-11-13 ENCOUNTER — Ambulatory Visit (INDEPENDENT_AMBULATORY_CARE_PROVIDER_SITE_OTHER): Payer: 59 | Admitting: Neurology

## 2017-11-13 ENCOUNTER — Encounter: Payer: Self-pay | Admitting: Neurology

## 2017-11-13 VITALS — BP 145/93 | HR 93 | Ht 59.0 in | Wt 155.0 lb

## 2017-11-13 DIAGNOSIS — G4719 Other hypersomnia: Secondary | ICD-10-CM | POA: Diagnosis not present

## 2017-11-13 DIAGNOSIS — G4761 Periodic limb movement disorder: Secondary | ICD-10-CM | POA: Diagnosis not present

## 2017-11-13 DIAGNOSIS — G4730 Sleep apnea, unspecified: Secondary | ICD-10-CM | POA: Diagnosis not present

## 2017-11-13 DIAGNOSIS — G4710 Hypersomnia, unspecified: Secondary | ICD-10-CM | POA: Diagnosis not present

## 2017-11-13 DIAGNOSIS — G2581 Restless legs syndrome: Secondary | ICD-10-CM

## 2017-11-13 DIAGNOSIS — D509 Iron deficiency anemia, unspecified: Secondary | ICD-10-CM

## 2017-11-13 DIAGNOSIS — R0683 Snoring: Secondary | ICD-10-CM

## 2017-11-13 NOTE — Progress Notes (Addendum)
SLEEP MEDICINE CLINIC   Provider:  Larey Seat, M.D.   Primary Care Physician:  Leanna Battles, MD   Referring Provider: Leanna Battles, MD    Chief Complaint  Patient presents with  . New Patient (Initial Visit)    Room 10.  Pt is alone here for excessive sleepiness and high degree of fatigue.     HPI:  Adrienne BOJARSKI is a 52 y.o. female patient and seen here on 11-13-2017 in a referral from Dr. Philip Aspen for a sleep evaluation.  Chief complaint according to patient: Adrienne Hayes. Richner is a 52 year old Caucasian right-handed female who presents with excessive daytime sleepiness, struggling at times to stay awake and fighting the irresistible urge to go to sleep.  She also feels fatigued.  Her husband has noted that she snores and has apnea-  he urge her evaluation for sleep apnea.  She struggled with insomnia for 12 month after depression, associated with both her parents death about 3 years ago. She was given Wellbutrin, but only took it "3 or 4 times".   Sleep habits are as follows: Dinnertime for the couple is usually between 5 and 6 PM, she may run errands or be physically active in some way but usually the evening hours are spent at home in a sedated fashion.  Bedtime for the patient is between 10 and 11 PM, the bedroom is cool quiet and dark.  She prefers to sleep on her back but that is when she snores the loudest.  She sleeps on 2 or more pillows, the bed is not raised.  Some nights she dreams vivid and lucid dreams, but not nightmares. She may have one bathroom break between 2 and 3 AM otherwise she can sleep through the night.  She usually wakes up spontaneously in the morning at 8 AM and estimates an average of 8 hours of sleep each night.  She still does not necessarily feel refreshed or restored.  Sometimes she wakes with a dry mouth and headaches, but this is the exception of the rule.   Sleep and Medical History and Family History: She endorsed the fatigue severity  score at 40 out of 63 points and the Epworth Sleepiness Scale at 20 out of 24 possible points.  She further endorsed restless legs, sometimes insomnia, decreased level of energy and daytime history of anemia, witnessed snoring and weight gain and hypertension.  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 She has no history of asthma, allergic rhinitis or sinus disease. No neck injuries, TBIs.   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 She is a caretaker of her parents. A lifelong non smoker, non drinker, caffeine user- coffee in AM and iced tea and soda in daytime- energy drinks often, too.    Review of Systems: Out of a complete 14 system review, the patient complains of only the following symptoms, and all other reviewed systems are negative. She endorsed the fatigue severity score at 40 out of 63 points and the Epworth Sleepiness Scale at 20 out of 24 possible points.  She further endorsed restless legs, sometimes insomnia, decreased level of energy and daytime history of anemia, witnessed snoring and weight gain and hypertension.  She had undergone a C-section in 2000 and a desmoid tumor  was removed in 2005 and 2 separate surgical sessions.  Snoring   Epworth score 20 , Fatigue severity score 40  , depression score n/A   Social History   Socioeconomic History  . Marital status: Married    Spouse name: Not on file  . Number of children: 1  . Years of education: Not on file  . Highest education level: Not on file  Occupational History    Comment: home maker  Social Needs  . Financial resource strain: Not on file  . Food insecurity:    Worry: Not on file    Inability: Not on file  . Transportation needs:    Medical: Not on file    Non-medical: Not on file  Tobacco Use  . Smoking status:  Never Smoker  . Smokeless tobacco: Never Used  Substance and Sexual Activity  . Alcohol use: No  . Drug use: No  . Sexual activity: Yes    Birth control/protection: None  Lifestyle  . Physical activity:    Days per week: Not on file    Minutes per session: Not on file  . Stress: Not on file  Relationships  . Social connections:    Talks on phone: Not on file    Gets together: Not on file    Attends religious service: Not on file    Active member of club or organization: Not on file    Attends meetings of clubs or organizations: Not on file    Relationship status: Not on file  . Intimate partner violence:    Fear of current or ex partner: Not on file    Emotionally abused: Not on file    Physically abused: Not on file    Forced sexual activity: Not on file  Other Topics Concern  . Not on file  Social History Narrative   Caffeine 5+daily    No family history on file.  Past Medical History:  Diagnosis Date  . Anemia    Hx  . Hypertension   . Insomnia     Past Surgical History:  Procedure Laterality Date  . benign tumor removed     x 2 in lower abd  . CESAREAN SECTION     x 1  . DILATATION & CURETTAGE/HYSTEROSCOPY WITH TRUECLEAR N/A 10/21/2013   Procedure: DILATATION & CURETTAGE/HYSTEROSCOPY WITH TRUCLEAR;  Surgeon: Margarette Asal, MD;  Location: [facility];  Service: Gynecology;  Laterality: N/A;  . GUM SURGERY    . WISDOM TOOTH EXTRACTION      Current Outpatient Medications  Medication Sig Dispense Refill  . amLODipine (NORVASC) 2.5 MG tablet Take 1 tablet by mouth daily.  1  . losartan (COZAAR) 100 MG tablet Take 1 tablet by mouth daily.  2   No current facility-administered medications for this visit.     Allergies as of 11/13/2017 - Review Complete 11/13/2017  Allergen Reaction Noted  . Iohexol  04/06/2004    Vitals: BP (!) 145/93   Pulse 93   Ht 4\' 11"  (1.499 m)   Wt 155 lb (70.3 kg)   BMI 31.31 kg/m  Last Weight:  Wt Readings from Last 1  Encounters:  11/13/17 155 lb (70.3 kg)   RSW:NIOE mass index is 31.31 kg/m.     Last Height:   Ht Readings from Last 1 Encounters:  11/13/17 4\' 11"  (1.499 m)    Physical exam:  General: The patient is awake, alert and appears not in acute distress. The patient is well groomed. Head:  Normocephalic, atraumatic. Neck is supple. Mallampati 3,  neck circumference:15. 25 ". Nasal airflow patent , Retrognathia is not seen, but a crowded lower jaw. .  Cardiovascular:  Regular rate and rhythm, without  murmurs or carotid bruit, and without distended neck veins. Respiratory: Lungs are clear to auscultation. Skin:  Without evidence of edema, or rash Trunk: BMI is 31. The patient's posture erect.   Neurologic exam : The patient is awake and alert, oriented to place and time.   Memory subjective described as intact.   MOCA:No flowsheet data found. MMSE:No flowsheet data found.     Attention span & concentration ability appears normal.  Speech is fluent,  without  Dysarthria, but with hoarseness-  dysphonia  That she finds is her baseline.  Mood and affect are depressed..  Cranial nerves: Pupils are equal and briskly reactive to light. Funduscopic exam without evidence of pallor or edema. Extraocular movements  in vertical and horizontal planes intact and without nystagmus. Visual fields by finger perimetry are intact. Hearing to finger rub intact.   Facial sensation intact to fine touch.  Facial motor strength is symmetric and tongue and uvula move midline. Shoulder shrug was symmetrical.   Motor exam:  Normal tone, muscle bulk and symmetric strength in all extremities. Sensory:  Fine touch, pinprick and vibration /proprioception tested in the upper extremities was normal. Coordination:  Finger-to-nose maneuver  normal without evidence of ataxia, dysmetria or tremor. Gait and station: Patient walks without assistive device and is able unassisted to climb up to the exam table. Strength  within normal limits. Stance is stable and normal. Tandem gait is unfragmented. Turns with 3 Steps. Romberg testing is negative. Deep tendon reflexes: in the upper and lower extremities are symmetric and intact. Babinski maneuver response is downgoing.  Assessment:  I reviewed the outcome of the  physical and neurologic examination, I  Reviewed laboratory studies,  Personal review of imaging studies, reports of other /same  Imaging studies and pre-existing records, as far as provided in visit.   My assessment is:  1) Excessive daytime sleepiness, fatigue.  She has been sleepy since 2005.  She sleeps well over 7 hours each night, rarely less.   2) RLS- coming and going symptoms. Husband noted her restlessness, her twitching and jerking. She has a history of anemia, may need to be rechecked for ferritin, TIBC, CBC with diff.    3)  OSA -Snoring and witnessed apnea, per husband.     The patient was advised of the nature of the diagnosed disorder , the treatment options and the  risks for general health and wellness arising from not treating the condition.   I spent more than 50 minutes of face to face time with the patient.  Greater than 50% of time was spent in counseling and coordination of care. We have discussed the diagnosis and differential and I answered the patient's questions.    Plan:  Treatment plan and additional workup :   Mrs. Hoey reports that she feels no longer clinically depressed, and that her sleepiness preceded any depression symptoms anyway.  She is very likely to have obstructive sleep apnea given her husband's observation of snoring and witnessed apnea also she is not morbidly obese and her airway is not extremely small.  He has noticed that she is sleeping restless, jerks and twitches at night and I do wonder if periodic limb movements affect the quality of her sleep problem- rather than  the duration of sleep and latency to  sleep.  I would very much prefer an attended  sleep study for this patient given that we investigate restless leg syndrome, periodic limb movements, excessive daytime sleepiness and fatigue in association with witnessed apneas and snoring.  Her degree of sleepiness is unusually high, but she does not endorse sleep paralysis, there have been no events of cataplexy, and no vivid dream intrusion. Therefor I will not primarily entertain a diagnosis of narcolepsy.   She has a history of iron deficiency anemia, and I will check her TIBC, ferritin- these overlap with RLS and PLM symptoms.   Follow up after PSG- or HST.    Larey Seat, MD 28/03/1515, 61:60 AM  Certified in Neurology by ABPN Certified in [place] by [facility] [address], [place], [place] [address]

## 2017-11-13 NOTE — Addendum Note (Signed)
Addended by: Larey Seat on: 11/13/2017 10:56 AM   Modules accepted: Orders

## 2017-11-13 NOTE — Patient Instructions (Signed)
Hypersomnia Hypersomnia is when you feel extremely tired during the day even though you're getting plenty of sleep at night. You may need to take naps during the day, and you may also be extremely difficult to wake up when you are sleeping. What are the causes? The cause of your hypersomnia may not be known. Hypersomnia may be caused by:  Medicines.  Sleep disorders, such as narcolepsy.  Trauma or injury to your head or nervous system.  Using drugs or alcohol.  Tumors.  Medical conditions, such as depression or hypothyroidism.  Genetics.  What are the signs or symptoms? The main symptoms of hypersomnia include:  Feeling extremely tired throughout the day.  Being very difficult to wake up.  Sleeping for longer and longer periods.  Taking naps throughout the day.  Other symptoms may include:  Feeling: ? Restless. ? Annoyed. ? Anxious. ? Low energy.  Having difficulty: ? Remembering. ? Speaking. ? Thinking.  Losing your appetite.  Experiencing hallucinations.  How is this diagnosed? Hypersomnia may be diagnosed by:  Medical history and physical exam. This will include a sleep history.  Completing sleep logs.  Tests may also be done, such as: ? Polysomnography. ? Multiple sleep latency test (MSLT).  How is this treated? There is no cure for hypersomnia, but treatment can be very effective in helping manage the condition. Treatment may include:  Lifestyle and sleeping strategies to help cope with the condition.  Stimulant medicines.  Treating any underlying causes of hypersomnia.  Follow these instructions at home:  Take medicines only as directed by your health care provider.  Schedule short naps for when you feel sleepiest during the day. Tell your employer or teachers that you have hypersomnia. You may be able to adjust your schedule to include time for naps.  Avoid drinking alcohol or caffeinated beverages.  Do not eat a heavy meal before  bedtime. Eat at about the same times every day.  Do not drive or operate heavy machinery if you are sleepy.  Do not swim or go out on the water without a life jacket.  If possible, adjust your schedule so that you do not have to work or be active at night.  Keep all follow-up visits as directed by your health care provider. This is important. Contact a health care provider if:  You have new symptoms.  Your symptoms get worse. Get help right away if: You have serious thoughts of hurting yourself or someone else. This information is not intended to replace advice given to you by your health care provider. Make sure you discuss any questions you have with your health care provider. Document Released: 12/17/2001 Document Revised: 06/04/2015 Document Reviewed: 08/01/2013 Elsevier Interactive Patient Education  2018 Elsevier Inc.  

## 2017-11-14 ENCOUNTER — Telehealth: Payer: Self-pay | Admitting: Neurology

## 2017-11-14 LAB — IRON,TIBC AND FERRITIN PANEL
Ferritin: 14 ng/mL — ABNORMAL LOW (ref 15–150)
IRON: 58 ug/dL (ref 27–159)
Iron Saturation: 14 % — ABNORMAL LOW (ref 15–55)
Total Iron Binding Capacity: 421 ug/dL (ref 250–450)
UIBC: 363 ug/dL (ref 131–425)

## 2017-11-14 LAB — CBC WITH DIFFERENTIAL/PLATELET
BASOS ABS: 0 10*3/uL (ref 0.0–0.2)
BASOS: 0 %
EOS (ABSOLUTE): 0.1 10*3/uL (ref 0.0–0.4)
Eos: 2 %
HEMATOCRIT: 39.1 % (ref 34.0–46.6)
HEMOGLOBIN: 12.8 g/dL (ref 11.1–15.9)
Immature Grans (Abs): 0 10*3/uL (ref 0.0–0.1)
Immature Granulocytes: 0 %
Lymphocytes Absolute: 1.2 10*3/uL (ref 0.7–3.1)
Lymphs: 20 %
MCH: 28.3 pg (ref 26.6–33.0)
MCHC: 32.7 g/dL (ref 31.5–35.7)
MCV: 86 fL (ref 79–97)
MONOCYTES: 6 %
MONOS ABS: 0.3 10*3/uL (ref 0.1–0.9)
NEUTROS ABS: 4.4 10*3/uL (ref 1.4–7.0)
Neutrophils: 72 %
Platelets: 286 10*3/uL (ref 150–450)
RBC: 4.53 x10E6/uL (ref 3.77–5.28)
RDW: 14.5 % (ref 12.3–15.4)
WBC: 6 10*3/uL (ref 3.4–10.8)

## 2017-11-14 LAB — COMPREHENSIVE METABOLIC PANEL
ALBUMIN: 4.7 g/dL (ref 3.5–5.5)
ALT: 11 IU/L (ref 0–32)
AST: 12 IU/L (ref 0–40)
Albumin/Globulin Ratio: 2 (ref 1.2–2.2)
Alkaline Phosphatase: 93 IU/L (ref 39–117)
BUN / CREAT RATIO: 16 (ref 9–23)
BUN: 15 mg/dL (ref 6–24)
Bilirubin Total: 0.8 mg/dL (ref 0.0–1.2)
CALCIUM: 9.5 mg/dL (ref 8.7–10.2)
CO2: 23 mmol/L (ref 20–29)
Chloride: 101 mmol/L (ref 96–106)
Creatinine, Ser: 0.93 mg/dL (ref 0.57–1.00)
GFR calc Af Amer: 81 mL/min/{1.73_m2} (ref >59)
GFR, EST NON AFRICAN AMERICAN: 70 mL/min/{1.73_m2} (ref >59)
GLOBULIN, TOTAL: 2.4 g/dL (ref 1.5–4.5)
GLUCOSE: 85 mg/dL (ref 65–99)
Potassium: 4.5 mmol/L (ref 3.5–5.2)
SODIUM: 139 mmol/L (ref 134–144)
Total Protein: 7.1 g/dL (ref 6.0–8.5)

## 2017-11-14 NOTE — Telephone Encounter (Signed)
Called the patient and advised her that there iron saturation and ferritin levels were low. Advised Dr Brett Fairy states this is likely the cause of the fatigue and RLS. Informed the patient that Dr Brett Fairy would recommend the patient start OTC iron supplement like a 325 mg slow FE iron or prenatal vitamin that has iron in it as these can be easier on the stomach. Informed the patient to take in the am with orange juice. Advised the patient that she would also like the patient to have IV infusion completed with IV iron. Informed the patient that this can be hard to get approved through insurance. Informed the patient that we will attempt to get the process going and see if we can get it processed for her. Pt verbalized understanding. Advised the patient that if she has not heard from [place] in 2-3 weeks to contact our office and talk with the infusion suite to assess the status on the Prior auth. Pt verbalized understanding. Once we get PA then we can schedule her for her labs 4-6 weeks after receiving the IV iron.

## 2017-11-14 NOTE — Telephone Encounter (Signed)
-----   Message from Larey Seat, MD sent at 11/14/2017  8:39 AM EST ----- Extremely low ferritin and iron! Thi is likely cause for fatigue and RLS.  We will need IV iron.  Arrange with intrafusion, please. Will also start oral iron with orange juice in AM , and recheck in 4-6 weeks.

## 2017-11-20 ENCOUNTER — Telehealth: Payer: Self-pay | Admitting: Neurology

## 2017-11-20 DIAGNOSIS — Z6832 Body mass index (BMI) 32.0-32.9, adult: Secondary | ICD-10-CM | POA: Diagnosis not present

## 2017-11-20 DIAGNOSIS — Z01419 Encounter for gynecological examination (general) (routine) without abnormal findings: Secondary | ICD-10-CM | POA: Diagnosis not present

## 2017-11-20 DIAGNOSIS — N951 Menopausal and female climacteric states: Secondary | ICD-10-CM | POA: Diagnosis not present

## 2017-11-20 NOTE — Telephone Encounter (Signed)
I have left a message with the iv infusion team informing them to check on this status for the patient. If pt calls back please inform her I have left this message with them along with her number and hopefully someone will call back or transfer to infusion team.

## 2017-11-20 NOTE — Telephone Encounter (Signed)
Pt requesting a call stating she would like an update on getting where our office stands with her insurance regarding her IV infusions.

## 2017-11-20 NOTE — Telephone Encounter (Signed)
This is processed and completed through iv infusions.Will need to check and verify with them on the status.

## 2017-11-23 NOTE — Telephone Encounter (Signed)
error 

## 2017-12-28 ENCOUNTER — Telehealth: Payer: Self-pay | Admitting: Neurology

## 2017-12-28 NOTE — Telephone Encounter (Signed)
Patient was called by our sleep lab to get scheduled and states that she never heard back from the infusion suite to get set up for her IV iron. I discussed this with Rise Paganini and she is requesting for a new script. Completed a new written order for the IV iron to be given and the infusion suite states they will contact the patient to get her scheduled.

## 2017-12-28 NOTE — Telephone Encounter (Signed)
The order was given on 12/9 for the 2nd time on infusion suite. On 12/28/17 when patient was called to confirm the sleep test time apt she informed them once more that she has not been contacted. We have sent this to our management team to follow up

## 2017-12-31 ENCOUNTER — Ambulatory Visit (INDEPENDENT_AMBULATORY_CARE_PROVIDER_SITE_OTHER): Payer: 59 | Admitting: Neurology

## 2017-12-31 DIAGNOSIS — G2581 Restless legs syndrome: Secondary | ICD-10-CM

## 2017-12-31 DIAGNOSIS — G4761 Periodic limb movement disorder: Secondary | ICD-10-CM

## 2017-12-31 DIAGNOSIS — G4710 Hypersomnia, unspecified: Secondary | ICD-10-CM | POA: Diagnosis not present

## 2017-12-31 DIAGNOSIS — G4730 Sleep apnea, unspecified: Secondary | ICD-10-CM | POA: Diagnosis not present

## 2017-12-31 DIAGNOSIS — G4719 Other hypersomnia: Secondary | ICD-10-CM

## 2018-01-01 DIAGNOSIS — Z1231 Encounter for screening mammogram for malignant neoplasm of breast: Secondary | ICD-10-CM | POA: Diagnosis not present

## 2018-01-04 NOTE — Addendum Note (Signed)
Addended by: Larey Seat on: 01/04/2018 03:59 PM   Modules accepted: Orders

## 2018-01-04 NOTE — Procedures (Signed)
PATIENT'S NAME:  Adrienne Hayes, Adrienne Hayes DOB:      07-22-1965      MR#:    [id number]     DATE OF RECORDING: 12/31/2017 REFERRING M.D.:  Adrienne Hayes, M.D Study Performed:   REM BD montage Polysomnogram HISTORY:   Adrienne Hayes is a 52 y.o. female patient and seen here on 11-13-2017 in a referral from Dr. Philip Hayes for a sleep evaluation.  Chief complaint according to patient: Adrienne Hayes is a 52 year old Caucasian right-handed female who presents with excessive daytime sleepiness, struggling at times to stay awake and fighting the irresistible urge to go to sleep.  She also feels very fatigued.  Her husband has noted that she snores and has apnea- he urge her evaluation for sleep apnea.  She struggled with insomnia for 12 month after depression, associated with both her parent's death, about 3 years ago. She was given Wellbutrin, but only took it "3 or 4 times". She sleeps restless, has vivid dreams, moves. She wakes unrefreshed.      The patient endorsed the Epworth Sleepiness Scale at 20/24 points, the FSS at 40 out of 63 points. .   The patient's weight 155 pounds with a height of 59 (inches), resulting in a BMI of 31.1 kg/m2. The patient's neck circumference measured 15.2 inches.  CURRENT MEDICATIONS: Norvasc, Cozaar   PROCEDURE:  This is a multichannel digital polysomnogram utilizing the Somnostar 11.2 system.  Electrodes and sensors were applied and monitored per AASM Specifications.   EEG, EOG, Chin and Limb EMG, were sampled at 200 Hz.  ECG, Snore and Nasal Pressure, Thermal Airflow, Respiratory Effort, CPAP Flow and Pressure, Oximetry was sampled at 50 Hz. Digital video and audio were recorded.      BASELINE STUDY: Lights Out was at 21:11 and Lights On at 04:59.  Total recording time (TRT) was 468.5 minutes, with a total sleep time (TST) of 392.5 minutes.   The patient's sleep latency was 60.5 minutes.  REM latency was 75.5 minutes.  The sleep efficiency was 83.8 %.     SLEEP ARCHITECTURE:  WASO (Wake after sleep onset) was 24 minutes.  There were 4 minutes in Stage N1, 282.5 minutes Stage N2, 52.5 minutes Stage N3 and 53.5 minutes in Stage REM.  The percentage of Stage N1 was 1.%, Stage N2 was 72.%, Stage N3 was 13.4% and Stage R (REM sleep) was 13.6%.   RESPIRATORY ANALYSIS:  There were a total of 35 respiratory events:  13 obstructive apneas, 2 central apneas and 0 mixed apneas with a total of 15 apneas and an apnea index (AI) of 2.3 /hour. There were 20 hypopneas with a hypopnea index of 3.1 /hour. The patient also had 0 respiratory event related arousals (RERAs). The total APNEA/HYPOPNEA INDEX (AHI) was 5.4 /hour and the total RESPIRATORY DISTURBANCE INDEX was 5.4 /hour. 14 events occurred in REM sleep and 24 events in NREM.  The REM AHI was 15.7 /hour, versus a non-REM AHI of 3.7. The patient spent 254.5 minutes of total sleep time in the supine position and 138 minutes in non-supine.  The supine AHI was 7.1 versus a non-supine AHI of 2.2.  OXYGEN SATURATION & C02:  The Wake baseline 02 saturation was 96%, with the lowest being 80%. Time spent below 89% saturation equaled 7 minutes.   PERIODIC LIMB MOVEMENTS:  The arousals were noted as: 33 were spontaneous, 37 were associated with PLMs, and 13 were associated with respiratory events. The patient had a total of  368 Periodic Limb Movements.  The Periodic Limb Movement (PLM) index was 56.3 and the PLM Arousal index was 5.7/hour.  Audio and video analysis did not show any abnormal or unusual movements, behaviors, phonations or vocalizations. Snoring was noted. EKG was in keeping with normal sinus rhythm (NSR) except for one. Post-study, the patient indicated that sleep was the same as usual.   IMPRESSION:  1. Very mild Obstructive Sleep Apnea (OSA), strong REM and positional dependence. 2. Periodic Limb Movement Disorder (PLMD) with some activity during REM sleep. See screenshot print. 3. Normal EKG.   RECOMMENDATIONS: this  patient's sleep apnea is mild, but could contribute to daytime sleepiness.  Advise avoiding the supine sleep position- that would already reduce the AHI to 2.2/h and would eliminate the need for CPAP therapy.  Encouraged are also weight loss, a dental device and use of melatonin. PLMs in REM were not numerous but are the hallmark of REM BD- again Melatonin and Klonopin are used to treat.   Return for MSLT if hypersomnia persists after weaning off REM suppressant medications. Will order HLA test, too.    I certify that I have reviewed the entire raw data recording prior to the issuance of this report in accordance with the Standards of Accreditation of the American Academy of Sleep Medicine (AASM)    Adrienne Seat, MD                  01-04-2018  Diplomat, American Board of Psychiatry and Neurology  Diplomat, American Board of [place] Director, [facility] [employer]

## 2018-01-04 NOTE — Progress Notes (Addendum)
PATIENT'S NAME:  Adrienne Hayes, Adrienne Hayes DOB:      11/09/1965      MR#:    [id number]     DATE OF RECORDING: 12/31/2017 REFERRING M.D.:  Leanna Battles, M.D Study Performed:   REM BD montage Polysomnogram HISTORY:   Adrienne Hayes is a 52 y.o. female patient and seen here on 11-13-2017 in a referral from Dr. Philip Aspen for a sleep evaluation.  Chief complaint according to patient: Adrienne Hayes is a 52 year old Caucasian right-handed female who presents with excessive daytime sleepiness, struggling at times to stay awake and fighting the irresistible urge to go to sleep.  She also feels very fatigued.  Her husband has noted that she snores and has apnea- he urge her evaluation for sleep apnea.  She struggled with insomnia for 12 month after depression, associated with both her parent's death, about 3 years ago. She was given Wellbutrin, but only took it "3 or 4 times". She sleeps restless, has vivid dreams, moves. She wakes unrefreshed. She was tested for iron deficiency and the results were abnormal ( 11-13-2017 ) .     The patient endorsed the Epworth Sleepiness Scale at 20/24 points, the FSS at 40 out of 63 points. .   The patient's weight 155 pounds with a height of 59 (inches), resulting in a BMI of 31.1 kg/m2. The patient's neck circumference measured 15.2 inches.  CURRENT MEDICATIONS: Norvasc, Cozaar   PROCEDURE:  This is a multichannel digital polysomnogram utilizing the Somnostar 11.2 system.  Electrodes and sensors were applied and monitored per AASM Specifications.   EEG, EOG, Chin and Limb EMG, were sampled at 200 Hz.  ECG, Snore and Nasal Pressure, Thermal Airflow, Respiratory Effort, CPAP Flow and Pressure, Oximetry was sampled at 50 Hz. Digital video and audio were recorded.      BASELINE STUDY: Lights Out was at 21:11 and Lights On at 04:59.  Total recording time (TRT) was 468.5 minutes, with a total sleep time (TST) of 392.5 minutes.   The patient's sleep latency was 60.5 minutes.  REM  latency was 75.5 minutes.  The sleep efficiency was 83.8 %.     SLEEP ARCHITECTURE: WASO (Wake after sleep onset) was 24 minutes.  There were 4 minutes in Stage N1, 282.5 minutes Stage N2, 52.5 minutes Stage N3 and 53.5 minutes in Stage REM.  The percentage of Stage N1 was 1.%, Stage N2 was 72.%, Stage N3 was 13.4% and Stage R (REM sleep) was 13.6%.   RESPIRATORY ANALYSIS:  There were a total of 35 respiratory events:  13 obstructive apneas, 2 central apneas and 0 mixed apneas with a total of 15 apneas and an apnea index (AI) of 2.3 /hour. There were 20 hypopneas with a hypopnea index of 3.1 /hour. The patient also had 0 respiratory event related arousals (RERAs). The total APNEA/HYPOPNEA INDEX (AHI) was 5.4 /hour and the total RESPIRATORY DISTURBANCE INDEX was 5.4 /hour. 14 events occurred in REM sleep and 24 events in NREM.  The REM AHI was 15.7 /hour, versus a non-REM AHI of 3.7. The patient spent 254.5 minutes of total sleep time in the supine position and 138 minutes in non-supine.  The supine AHI was 7.1 versus a non-supine AHI of 2.2.  OXYGEN SATURATION & C02:  The Wake baseline 02 saturation was 96%, with the lowest being 80%. Time spent below 89% saturation equaled 7 minutes.   PERIODIC LIMB MOVEMENTS:  The arousals were noted as: 33 were spontaneous, 37 were associated with  PLMs, and 13 were associated with respiratory events. The patient had a total of 368 Periodic Limb Movements.  The Periodic Limb Movement (PLM) index was 56.3 and the PLM Arousal index was 5.7/hour.  Audio and video analysis did not show any abnormal or unusual movements, behaviors, phonations or vocalizations. Snoring was noted. EKG was in keeping with normal sinus rhythm (NSR) except for one. Post-study, the patient indicated that sleep was the same as usual.   IMPRESSION:  1. Very mild Obstructive Sleep Apnea (OSA), strong REM and positional dependence. 2. Periodic Limb Movement Disorder (PLMD) with some activity  during REM sleep. See screenshot print. 3. Normal EKG.   RECOMMENDATIONS: this patient's sleep apnea is mild, but could contribute to daytime sleepiness.  Advise avoiding the supine sleep position- that would already reduce the AHI to 2.2/h and would eliminate the need for CPAP therapy.  Encouraged are also weight loss, a dental device and use of melatonin. PLMs in REM were not numerous but are the hallmark of REM BD- again Melatonin and Klonopin are used to treat.   Return for MSLT if hypersomnia persists after weaning off REM suppressant medications. Will order HLA test, too.    I certify that I have reviewed the entire raw data recording prior to the issuance of this report in accordance with the Standards of Accreditation of the American Academy of Sleep Medicine (AASM)    Larey Seat, MD                  01-04-2018  Diplomat, American Board of Psychiatry and Neurology  Diplomat, American Board of [place] Director, [facility] [employer]

## 2018-01-08 ENCOUNTER — Telehealth: Payer: Self-pay | Admitting: Neurology

## 2018-01-08 ENCOUNTER — Other Ambulatory Visit: Payer: Self-pay | Admitting: Neurology

## 2018-01-08 DIAGNOSIS — G4761 Periodic limb movement disorder: Secondary | ICD-10-CM

## 2018-01-08 DIAGNOSIS — D509 Iron deficiency anemia, unspecified: Secondary | ICD-10-CM

## 2018-01-08 DIAGNOSIS — G2581 Restless legs syndrome: Secondary | ICD-10-CM

## 2018-01-08 NOTE — Telephone Encounter (Signed)
Called the patient and reviewed the sleep study with her. Advised the patient of the findings. Dr Dohmeier recommends the patient avoid sleeping on her back since her apnea was treated when off her back. She states that she can avoid having to use CPAP by doing that. Advised the patient to help with treatment of rem behavior disorder she can start with taking OTC melatonin. Informed the patient that with taking that and hopefully since receiving her iron infusion last week which we hope will improve the ferritin levels for her. Hopefully with these her movements will stop. Advised the patient that if this doesn't improve her daytime sleepiness then we would move forward with working her up for narcolepsy. Pt verbalized understanding. Advised the patient to come in first week of feb to re draw her labs to assess iron levels post her infusion last thur on 01/04/18. Pt verbalized understanding. Pt had no questions at this time but was encouraged to call back if questions arise.

## 2018-02-13 ENCOUNTER — Other Ambulatory Visit (INDEPENDENT_AMBULATORY_CARE_PROVIDER_SITE_OTHER): Payer: 59

## 2018-02-13 ENCOUNTER — Telehealth: Payer: Self-pay | Admitting: Neurology

## 2018-02-13 DIAGNOSIS — G4761 Periodic limb movement disorder: Secondary | ICD-10-CM | POA: Diagnosis not present

## 2018-02-13 DIAGNOSIS — G4719 Other hypersomnia: Secondary | ICD-10-CM

## 2018-02-13 DIAGNOSIS — G4730 Sleep apnea, unspecified: Secondary | ICD-10-CM

## 2018-02-13 DIAGNOSIS — D509 Iron deficiency anemia, unspecified: Secondary | ICD-10-CM | POA: Diagnosis not present

## 2018-02-13 DIAGNOSIS — G4710 Hypersomnia, unspecified: Secondary | ICD-10-CM

## 2018-02-13 DIAGNOSIS — G2581 Restless legs syndrome: Secondary | ICD-10-CM | POA: Diagnosis not present

## 2018-02-13 NOTE — Telephone Encounter (Signed)
Pt is asking for a call from RN to let her know if this week is going to be considered to be too soon for her to come in for lab work.  Please call

## 2018-02-13 NOTE — Telephone Encounter (Signed)
Called the patient back and advised this week would be fine to come in and get labs. Advised the patient of the time frame of the lab hrs. Pt verbalized understanding.

## 2018-02-15 ENCOUNTER — Telehealth: Payer: Self-pay | Admitting: Neurology

## 2018-02-15 NOTE — Telephone Encounter (Signed)
Called the patient and there was no answer. Pt mail box was full and was unable to leave a message for the patient. Will attempt later.  **IF pt calls back please inform the patient that her lab work looked good and was better since getting the IV iron. She would need to continue to take oral iron over the counter to keep levels up and patient should recheck levels in 6 months.

## 2018-02-15 NOTE — Telephone Encounter (Signed)
-----   Message from Larey Seat, MD sent at 02/14/2018 12:32 PM EST ----- Iron sat, UIBC, Ferretin and TIBC have improved, ferritin should stay over 50 -  Plan:  if the patient can take oral iron she can continue using it, maintaining the improved levels form here. Recheck in 6 month recommended.

## 2018-02-21 ENCOUNTER — Encounter: Payer: Self-pay | Admitting: Podiatry

## 2018-02-21 ENCOUNTER — Ambulatory Visit: Payer: 59 | Admitting: Podiatry

## 2018-02-21 ENCOUNTER — Ambulatory Visit (INDEPENDENT_AMBULATORY_CARE_PROVIDER_SITE_OTHER): Payer: 59

## 2018-02-21 ENCOUNTER — Other Ambulatory Visit: Payer: Self-pay | Admitting: Podiatry

## 2018-02-21 VITALS — BP 126/82 | HR 96

## 2018-02-21 DIAGNOSIS — M2011 Hallux valgus (acquired), right foot: Secondary | ICD-10-CM

## 2018-02-21 DIAGNOSIS — B351 Tinea unguium: Secondary | ICD-10-CM

## 2018-02-21 DIAGNOSIS — M79672 Pain in left foot: Secondary | ICD-10-CM

## 2018-02-21 DIAGNOSIS — M21619 Bunion of unspecified foot: Secondary | ICD-10-CM | POA: Diagnosis not present

## 2018-02-21 DIAGNOSIS — M79671 Pain in right foot: Secondary | ICD-10-CM

## 2018-02-21 DIAGNOSIS — M2012 Hallux valgus (acquired), left foot: Principal | ICD-10-CM

## 2018-02-21 LAB — HEPATIC FUNCTION PANEL
AG Ratio: 1.9 (calc) (ref 1.0–2.5)
ALT: 12 U/L (ref 6–29)
AST: 10 U/L (ref 10–35)
Albumin: 4.5 g/dL (ref 3.6–5.1)
Alkaline phosphatase (APISO): 71 U/L (ref 37–153)
BILIRUBIN DIRECT: 0.2 mg/dL (ref 0.0–0.2)
BILIRUBIN INDIRECT: 0.7 mg/dL (ref 0.2–1.2)
Globulin: 2.4 g/dL (calc) (ref 1.9–3.7)
Total Bilirubin: 0.9 mg/dL (ref 0.2–1.2)
Total Protein: 6.9 g/dL (ref 6.1–8.1)

## 2018-02-21 LAB — NARCOLEPSY EVALUATION
HLA-DQ ALPHA: POSITIVE
HLA-DQ BETA: NEGATIVE

## 2018-02-21 LAB — IRON AND TIBC
Iron Saturation: 33 % (ref 15–55)
Iron: 114 ug/dL (ref 27–159)
Total Iron Binding Capacity: 350 ug/dL (ref 250–450)
UIBC: 236 ug/dL (ref 131–425)

## 2018-02-21 LAB — FERRITIN: Ferritin: 54 ng/mL (ref 15–150)

## 2018-02-21 MED ORDER — TERBINAFINE HCL 250 MG PO TABS: 250.0000 mg | ORAL_TABLET | Freq: Every day | ORAL | 0 refills | Status: DC

## 2018-02-21 NOTE — Progress Notes (Signed)
Subjective:   Patient ID: Adrienne Hayes, female   DOB: 53 y.o.   MRN: [id number]   HPI Patient presents with concerns about discoloration of her nailbeds of 2-year duration and also structural bunion deformity right over left which makes it increasingly difficult to find shoe gear that is comfortable.  Patient does not smoke and likes to be active and states it is been an ongoing long-term problem with the bunion on the right foot   Review of Systems  All other systems reviewed and are negative.       Objective:  Physical Exam Vitals signs and nursing note reviewed.  Constitutional:      Appearance: She is well-developed.  Pulmonary:     Effort: Pulmonary effort is normal.  Musculoskeletal: Normal range of motion.  Skin:    General: Skin is warm.  Neurological:     Mental Status: She is alert.     Neurovascular status intact muscle strength is adequate range of motion within normal limits with patient noted to have significant structural bunion deformity right with redness around the joint and nail disease mostly the big toenails fifth nails with several of the nails having involvement.  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  She does want to get this done but would like to wait till later in the year she lives in [place] part of the year and I also discussed the nails and she wants aggressive conservative treatment and I recommended oral antifungal along with laser therapy and I did go ahead and I sent her out for liver function studies we will start her on Lamisil which I educated her on today and  she is scheduled for laser therapy of the nails to be done bilateral.  I have also recommended pictures of her feet at the same time  X-ray indicates structural bunion deformity right over left with large prominence of the medial aspect first metatarsal

## 2018-02-26 ENCOUNTER — Telehealth: Payer: Self-pay | Admitting: *Deleted

## 2018-02-26 NOTE — Telephone Encounter (Signed)
"  I saw Dr. Paulla Dolly last week.  He suggested I have a bunion surgery on my right foot.  I'm calling to see what the options were date wise to get that done.  If you want to, you can call me back at [phone]."

## 2018-02-27 NOTE — Telephone Encounter (Signed)
"  I saw Dr. Paulla Dolly last week.  He wants me to have bunion surgery.  I'd like to schedule that.  Also, if you could check.  I never heard from the blood test.  I'm taking Lamisil.  Am I supposed to be taking that?  Give me a call back."

## 2018-02-28 NOTE — Telephone Encounter (Signed)
"  Dr. Paulla Dolly wanted me to have surgery.  I was just trying to get that scheduled.  Adrienne Hayes out of town a lot so I was just figure out when the best time is.  I'm trying to get that foot surgery scheduled.  Thank you so much."  I attempted to return her call on home an mobile phone numbers.  I could not leave messages because her mailboxes were full.

## 2018-02-28 NOTE — Telephone Encounter (Signed)
"  Sorry I missed your call.  I'm calling to see if I can get my surgery scheduled."  Dr. Paulla Dolly does surgeries on Tuesdays.  Do you have a date you would like to do it on?  "What days does he have available?"  He can do it on March 3 or 11.  "Let's put it down for March 3 for now.  Can I call you if I need to change it to the eleventh upon speaking to my husband?"  Yes, if you need to reschedule it, give me a call.  You need to see Dr. Paulla Dolly prior to that date for a consultation.  "We live out of town.  Can I sign it the day before my surgery date?"  Yes, that will be fine.  "Can you schedule that for me or do I need to speak to someone else?"  I'll transfer you to a scheduler.  I transferred her to [place].

## 2018-03-09 ENCOUNTER — Ambulatory Visit: Payer: 59 | Admitting: Podiatry

## 2018-03-12 ENCOUNTER — Ambulatory Visit: Payer: 59 | Admitting: Podiatry

## 2018-03-12 ENCOUNTER — Encounter: Payer: Self-pay | Admitting: Podiatry

## 2018-03-12 DIAGNOSIS — B351 Tinea unguium: Secondary | ICD-10-CM

## 2018-03-12 DIAGNOSIS — M21619 Bunion of unspecified foot: Secondary | ICD-10-CM

## 2018-03-12 DIAGNOSIS — M21611 Bunion of right foot: Secondary | ICD-10-CM | POA: Diagnosis not present

## 2018-03-12 NOTE — Patient Instructions (Signed)
Pre-Operative Instructions  Congratulations, you have decided to take an important step towards improving your quality of life.  You can be assured that the doctors and staff at [facility] will be with you every step of the way.  Here are some important things you should know:  1. Plan to be at the surgery center/hospital at least 1 (one) hour prior to your scheduled time, unless otherwise directed by the surgical center/hospital staff.  You must have a responsible adult accompany you, remain during the surgery and drive you home.  Make sure you have directions to the surgical center/hospital to ensure you arrive on time. 2. If you are having surgery at [facility] or [facility] hospitals, you will need a copy of your medical history and physical form from your family physician within one month prior to the date of surgery. We will give you a form for your primary physician to complete.  3. We make every effort to accommodate the date you request for surgery.  However, there are times where surgery dates or times have to be moved.  We will contact you as soon as possible if a change in schedule is required.   4. No aspirin/ibuprofen for one week before surgery.  If you are on aspirin, any non-steroidal anti-inflammatory medications (Mobic, Aleve, Ibuprofen) should not be taken seven (7) days prior to your surgery.  You make take Tylenol for pain prior to surgery.  5. Medications - If you are taking daily heart and blood pressure medications, seizure, reflux, allergy, asthma, anxiety, pain or diabetes medications, make sure you notify the surgery center/hospital before the day of surgery so they can tell you which medications you should take or avoid the day of surgery. 6. No food or drink after midnight the night before surgery unless directed otherwise by surgical center/hospital staff. 7. No alcoholic beverages 24-hours prior to surgery.  No smoking 24-hours prior or 24-hours after  surgery. 8. Wear loose pants or shorts. They should be loose enough to fit over bandages, boots, and casts. 9. Don't wear slip-on shoes. Sneakers are preferred. 10. Bring your boot with you to the surgery center/hospital.  Also bring crutches or a walker if your physician has prescribed it for you.  If you do not have this equipment, it will be provided for you after surgery. 11. If you have not been contacted by the surgery center/hospital by the day before your surgery, call to confirm the date and time of your surgery. 12. Leave-time from work may vary depending on the type of surgery you have.  Appropriate arrangements should be made prior to surgery with your employer. 13. Prescriptions will be provided immediately following surgery by your doctor.  Fill these as soon as possible after surgery and take the medication as directed. Pain medications will not be refilled on weekends and must be approved by the doctor. 14. Remove nail polish on the operative foot and avoid getting pedicures prior to surgery. 15. Wash the night before surgery.  The night before surgery wash the foot and leg well with water and the antibacterial soap provided. Be sure to pay special attention to beneath the toenails and in between the toes.  Wash for at least three (3) minutes. Rinse thoroughly with water and dry well with a towel.  Perform this wash unless told not to do so by your physician.  Enclosed: 1 Ice pack (please put in freezer the night before surgery)   1 Hibiclens skin cleaner     Pre-op instructions  If you have any questions regarding the instructions, please do not hesitate to call our office.  [place]: [address], [place], [place] [address] -- [phone]  [place]: [address] [place], [place] [address] -- [phone]  [place]: [address]  [place], [place] [address] -- [phone]  [place]: [address], [address], [place], [place] [address] -- [phone]  Website:  [email or web address] 

## 2018-03-13 ENCOUNTER — Encounter: Payer: Self-pay | Admitting: Podiatry

## 2018-03-13 DIAGNOSIS — M21611 Bunion of right foot: Secondary | ICD-10-CM | POA: Diagnosis not present

## 2018-03-13 DIAGNOSIS — M2011 Hallux valgus (acquired), right foot: Secondary | ICD-10-CM | POA: Diagnosis not present

## 2018-03-14 NOTE — Progress Notes (Signed)
Subjective:   Patient ID: Adrienne Hayes, female   DOB: 53 y.o.   MRN: [id number]   HPI Patient presents stating she is here for consultation concerning correction of her right bunion which is been bothering her for a long time and making shoe gear and walking difficult   ROS      Objective:  Physical Exam  Neurovascular status with patient found to have large prominence first metatarsal right that is red and painful when pressed and makes walking in shoe gear difficult.  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  At this point I allowed patient to read consent form for correction reviewing the procedure risk and patient wants surgery understanding the condition and understanding alternative treatments complications.  Patient signed consent form after extensive review and is scheduled for outpatient surgery and is encouraged to call with any questions concerns she may have and she is scheduled tomorrow for the procedure.  Patient is dispensed air fracture walker with all instructions on usage and understands the total recovery can take approximately 6 months

## 2018-03-19 ENCOUNTER — Encounter: Payer: 59 | Admitting: *Deleted

## 2018-03-21 ENCOUNTER — Other Ambulatory Visit: Payer: Self-pay

## 2018-03-21 ENCOUNTER — Ambulatory Visit (INDEPENDENT_AMBULATORY_CARE_PROVIDER_SITE_OTHER): Payer: 59 | Admitting: Podiatry

## 2018-03-21 ENCOUNTER — Encounter: Payer: Self-pay | Admitting: Podiatry

## 2018-03-21 ENCOUNTER — Ambulatory Visit (INDEPENDENT_AMBULATORY_CARE_PROVIDER_SITE_OTHER): Payer: 59

## 2018-03-21 VITALS — Temp 98.3°F

## 2018-03-21 DIAGNOSIS — Z09 Encounter for follow-up examination after completed treatment for conditions other than malignant neoplasm: Secondary | ICD-10-CM

## 2018-03-21 DIAGNOSIS — M21611 Bunion of right foot: Secondary | ICD-10-CM

## 2018-03-21 DIAGNOSIS — M21619 Bunion of unspecified foot: Secondary | ICD-10-CM

## 2018-03-22 NOTE — Progress Notes (Signed)
Subjective:   Patient ID: Adrienne Hayes, female   DOB: 53 y.o.   MRN: [id number]   HPI Patient states doing very well with her right foot and states it is been improving and she is not needed any medicines for the last few days   ROS      Objective:  Physical Exam  Neurovascular status intact negative Homans sign noted wound edges well coapted hallux in rectus position with good range of motion no crepitus of the joint noted upon palpation     Assessment:  Doing well post osteotomy right     Plan:  H&P x-ray reviewed discussed continue compression elevation immobilization and range of motion exercises and will be seen back again in 3 weeks or earlier if needed  X-ray indicates osteotomies healing well good alignment joint congruence

## 2018-03-28 ENCOUNTER — Ambulatory Visit: Payer: 59

## 2018-03-28 ENCOUNTER — Encounter: Payer: Self-pay | Admitting: Podiatry

## 2018-03-28 ENCOUNTER — Ambulatory Visit (INDEPENDENT_AMBULATORY_CARE_PROVIDER_SITE_OTHER): Payer: 59 | Admitting: Podiatry

## 2018-03-28 ENCOUNTER — Other Ambulatory Visit: Payer: Self-pay

## 2018-03-28 DIAGNOSIS — Z09 Encounter for follow-up examination after completed treatment for conditions other than malignant neoplasm: Secondary | ICD-10-CM

## 2018-03-28 DIAGNOSIS — M21619 Bunion of unspecified foot: Secondary | ICD-10-CM

## 2018-04-01 NOTE — Progress Notes (Signed)
Subjective:   Patient ID: Adrienne Hayes, female   DOB: 53 y.o.   MRN: [id number]   HPI Patient presents stating she is got some mild redness around her incision site right and she wanted to make sure there was no problems and overall is doing pretty well with mild increase in swelling as she has gotten more active   ROS      Objective:  Physical Exam  Neurovascular status unchanged negative Homans sign is noted with patient incision site on the right first metatarsal found to have very light redness but I did not note drainage or any other form of pathology with no proximal edema erythema drainage noted no systemic signs of infection     Assessment:  Probability that this is a localized irritation with no indication of current infective process     Plan:  Precautionary x-ray taken advised on soaks compression and if any increase in redness or any drainage or pain were to occur she is to contact me immediately  X-ray indicates good structural correction with good alignment and joint congruence

## 2018-04-02 ENCOUNTER — Ambulatory Visit: Payer: 59 | Admitting: Podiatry

## 2018-04-02 ENCOUNTER — Other Ambulatory Visit: Payer: 59

## 2018-04-09 ENCOUNTER — Other Ambulatory Visit: Payer: 59

## 2018-04-11 ENCOUNTER — Ambulatory Visit (INDEPENDENT_AMBULATORY_CARE_PROVIDER_SITE_OTHER): Payer: 59 | Admitting: Podiatry

## 2018-04-11 ENCOUNTER — Ambulatory Visit (INDEPENDENT_AMBULATORY_CARE_PROVIDER_SITE_OTHER): Payer: 59

## 2018-04-11 ENCOUNTER — Other Ambulatory Visit: Payer: Self-pay

## 2018-04-11 ENCOUNTER — Encounter: Payer: Self-pay | Admitting: Podiatry

## 2018-04-11 VITALS — Temp 98.3°F

## 2018-04-11 DIAGNOSIS — M2011 Hallux valgus (acquired), right foot: Secondary | ICD-10-CM | POA: Diagnosis not present

## 2018-04-11 NOTE — Progress Notes (Signed)
Subjective:   Patient ID: Adrienne Hayes, female   DOB: 53 y.o.   MRN: [id number]   HPI Patient states doing very well with surgery and very happy with how she is doing with mild pain and occasional pain on the outside of her foot if she walks too much   ROS      Objective:  Physical Exam  Neurovascular status intact with patient's right foot healing well wound edges well coapted hallux in rectus position with good range of motion no crepitus of the joint     Assessment:  Doing well overall with range of motion that is mildly restricted but adequate with no crepitus and good alignment of the first MPJ     Plan:  H&P condition reviewed and recommended continued elevation compression immobilization as needed with gradual return to tennis shoes and continued range of motion exercises.  I then dispensed ankle compression stocking for right  X-ray indicates the osteotomy is healing well fixation in place joint congruence

## 2018-04-27 ENCOUNTER — Telehealth: Payer: Self-pay | Admitting: Podiatry

## 2018-04-27 MED ORDER — TERBINAFINE HCL 250 MG PO TABS: 250.0000 mg | ORAL_TABLET | Freq: Every day | ORAL | 0 refills | Status: DC

## 2018-04-27 NOTE — Telephone Encounter (Signed)
Patient wanted to know if she should continue to take fer nail fungus medicaition. Insurance is telling [facility] they wont pay and on her last pill bottle it says no refills. Please call patient

## 2018-04-27 NOTE — Addendum Note (Signed)
Addended by: Harriett Sine D on: 04/27/2018 12:28 PM   Modules accepted: Orders

## 2018-04-27 NOTE — Telephone Encounter (Signed)
I informed pt of Dr. Paulla Dolly okay to refill for #23 terbinafine.

## 2018-05-09 ENCOUNTER — Other Ambulatory Visit: Payer: Self-pay

## 2018-05-09 ENCOUNTER — Encounter: Payer: Self-pay | Admitting: Podiatry

## 2018-05-09 ENCOUNTER — Ambulatory Visit (INDEPENDENT_AMBULATORY_CARE_PROVIDER_SITE_OTHER): Payer: 59 | Admitting: Podiatry

## 2018-05-09 ENCOUNTER — Ambulatory Visit (INDEPENDENT_AMBULATORY_CARE_PROVIDER_SITE_OTHER): Payer: 59

## 2018-05-09 VITALS — Temp 98.2°F

## 2018-05-09 DIAGNOSIS — M21619 Bunion of unspecified foot: Secondary | ICD-10-CM

## 2018-05-09 DIAGNOSIS — M2011 Hallux valgus (acquired), right foot: Secondary | ICD-10-CM

## 2018-05-09 DIAGNOSIS — Z09 Encounter for follow-up examination after completed treatment for conditions other than malignant neoplasm: Secondary | ICD-10-CM

## 2018-05-09 NOTE — Progress Notes (Signed)
Subjective:   Patient ID: Adrienne Hayes, female   DOB: 53 y.o.   MRN: [id number]   HPI Patient states overall doing well with occasional mild discomfort but I do want to return to normal shoe gear   ROS      Objective:  Physical Exam  Neurovascular status intact negative Homans sign noted with patient's right foot healing well wound edges well coapted hallux in rectus position range of motion good within the joint     Assessment:  Doing well post osteotomy first metatarsal right     Plan:  H&P condition reviewed and allowed patient to return to normal activity shoe gear and patient is encouraged to call with any questions concerns and reappoint 8 weeks  X-ray indicates osteotomies healing well fixation in place joint congruence

## 2018-05-18 ENCOUNTER — Ambulatory Visit (INDEPENDENT_AMBULATORY_CARE_PROVIDER_SITE_OTHER): Payer: Self-pay

## 2018-05-18 ENCOUNTER — Other Ambulatory Visit: Payer: Self-pay

## 2018-05-18 DIAGNOSIS — B351 Tinea unguium: Secondary | ICD-10-CM

## 2018-05-18 NOTE — Progress Notes (Signed)
Pt presents with mycotic infection of nails 1-5 bilateral.  All other systems are negative  Laser therapy administered to affected nails and tolerated well. All safety precautions were in place.  3rd treatment.  Follow up in 4 weeks     

## 2018-05-18 NOTE — Patient Instructions (Signed)
3 tablespoon of: coconut oil, olive oil, or almond oil ( any carrier oil) 10-15 drops of tea tree oil  Use mixture, rub into feet and toenails 2-3 times a week  Follow-up care is important to the success of your foot laser treatment. Please keep these instructions for future reference.    1. Normal activity can resume immediately.   2. IF you doctor prescribed an antifungal cream apply medication to the skin on the bottom of your feet, sides of your feet, between your toes, and around your nails. (This cream is not intended for use on your nails) Apply cream as directed  3. IF a topical for your nails was prescribed by your doctor, apply to nail/nails once daily until nail is free from infection unless otherwise directed by your doctor. Maximum use for nail topical is 12 months.   4. Spray the insides of your shoes with an over the counter antifungal spray or Lysol disinfectant (aerosol can) at the end of each day or use an ultra violet shoe sterilizer as directed. Try not to wear the same shoes everyday.   5. Buy new nail clippers and files since your current pair may be infected. Metal nail care instruments may also be cleaned with diluted bleach or boiling water. DO NOT share nail clippers or nail files.  6. Keep your toenails trimmed and clean.   7. Wear flip-flops in public places especially hotel rooms and showers, athletic club locker rooms and showers and indoor swimming pools.   8. Avoid nail salons that do not clean their instruments properly or use a whirlpool system.

## 2018-05-28 NOTE — Progress Notes (Signed)
This encounter was created in error - please disregard.

## 2018-06-18 ENCOUNTER — Other Ambulatory Visit: Payer: Self-pay

## 2018-06-18 ENCOUNTER — Ambulatory Visit (INDEPENDENT_AMBULATORY_CARE_PROVIDER_SITE_OTHER): Payer: Self-pay | Admitting: Podiatry

## 2018-06-18 DIAGNOSIS — B351 Tinea unguium: Secondary | ICD-10-CM

## 2018-06-25 NOTE — Progress Notes (Signed)
Pt presents with mycotic infection of nails 1-5 bilateral.  All other systems are negative  Laser therapy administered to affected nails and tolerated well. All safety precautions were in place.  4th treatment.  Follow up in prn

## 2018-07-04 ENCOUNTER — Other Ambulatory Visit: Payer: Self-pay

## 2018-07-04 ENCOUNTER — Ambulatory Visit: Payer: 59 | Admitting: Podiatry

## 2018-07-04 ENCOUNTER — Encounter: Payer: Self-pay | Admitting: Podiatry

## 2018-07-04 ENCOUNTER — Ambulatory Visit (INDEPENDENT_AMBULATORY_CARE_PROVIDER_SITE_OTHER): Payer: 59

## 2018-07-04 VITALS — Temp 98.5°F

## 2018-07-04 DIAGNOSIS — M79671 Pain in right foot: Secondary | ICD-10-CM

## 2018-07-04 DIAGNOSIS — M779 Enthesopathy, unspecified: Secondary | ICD-10-CM | POA: Diagnosis not present

## 2018-07-04 DIAGNOSIS — M2011 Hallux valgus (acquired), right foot: Secondary | ICD-10-CM | POA: Diagnosis not present

## 2018-07-08 NOTE — Progress Notes (Signed)
Subjective:   Patient ID: Adrienne Hayes, female   DOB: 53 y.o.   MRN: [id number]   HPI Patient states doing good with right foot but having inflammation and pain on the outside of the bone where she probably was walking differently but it is become its own problem now   ROS      Objective:  Physical Exam  Doing well with the first MPJ right with good alignment noted fixation in place good healing of the skin with inflammation fluid around the fifth MPJ right     Assessment:  Doing well post bunion surgery right with inflammation fluid around the fifth MPJ     Plan:  H&P x-ray reviewed and sterile prep careful injection administered of the fifth MPJ 3 mg dexamethasone Kenalog 5 mg Xylocaine advised on wider shoes  X-rays indicate osteotomies healing well fixation good joint congruence

## 2020-11-20 DIAGNOSIS — R051 Acute cough: Secondary | ICD-10-CM | POA: Diagnosis not present

## 2020-11-20 DIAGNOSIS — R509 Fever, unspecified: Secondary | ICD-10-CM | POA: Diagnosis not present

## 2020-11-20 DIAGNOSIS — J0100 Acute maxillary sinusitis, unspecified: Secondary | ICD-10-CM | POA: Diagnosis not present

## 2020-11-24 DIAGNOSIS — E785 Hyperlipidemia, unspecified: Secondary | ICD-10-CM | POA: Diagnosis not present

## 2020-12-01 DIAGNOSIS — I10 Essential (primary) hypertension: Secondary | ICD-10-CM | POA: Diagnosis not present

## 2020-12-01 DIAGNOSIS — Z0000 Encounter for general adult medical examination without abnormal findings: Secondary | ICD-10-CM | POA: Diagnosis not present

## 2020-12-01 DIAGNOSIS — Z125 Encounter for screening for malignant neoplasm of prostate: Secondary | ICD-10-CM | POA: Diagnosis not present

## 2020-12-01 DIAGNOSIS — G4700 Insomnia, unspecified: Secondary | ICD-10-CM | POA: Diagnosis not present

## 2020-12-01 DIAGNOSIS — R82998 Other abnormal findings in urine: Secondary | ICD-10-CM | POA: Diagnosis not present

## 2020-12-01 DIAGNOSIS — Z23 Encounter for immunization: Secondary | ICD-10-CM | POA: Diagnosis not present

## 2021-02-08 DIAGNOSIS — L814 Other melanin hyperpigmentation: Secondary | ICD-10-CM | POA: Diagnosis not present

## 2021-02-08 DIAGNOSIS — C44619 Basal cell carcinoma of skin of left upper limb, including shoulder: Secondary | ICD-10-CM | POA: Diagnosis not present

## 2021-02-08 DIAGNOSIS — L989 Disorder of the skin and subcutaneous tissue, unspecified: Secondary | ICD-10-CM | POA: Diagnosis not present

## 2021-02-08 DIAGNOSIS — D1801 Hemangioma of skin and subcutaneous tissue: Secondary | ICD-10-CM | POA: Diagnosis not present

## 2021-02-08 DIAGNOSIS — L570 Actinic keratosis: Secondary | ICD-10-CM | POA: Diagnosis not present

## 2021-02-08 DIAGNOSIS — C44319 Basal cell carcinoma of skin of other parts of face: Secondary | ICD-10-CM | POA: Diagnosis not present

## 2021-02-08 DIAGNOSIS — D485 Neoplasm of uncertain behavior of skin: Secondary | ICD-10-CM | POA: Diagnosis not present

## 2021-02-08 DIAGNOSIS — C44519 Basal cell carcinoma of skin of other part of trunk: Secondary | ICD-10-CM | POA: Diagnosis not present

## 2021-02-08 DIAGNOSIS — Z85828 Personal history of other malignant neoplasm of skin: Secondary | ICD-10-CM | POA: Diagnosis not present

## 2021-03-16 DIAGNOSIS — C44319 Basal cell carcinoma of skin of other parts of face: Secondary | ICD-10-CM | POA: Diagnosis not present

## 2021-03-25 DIAGNOSIS — D485 Neoplasm of uncertain behavior of skin: Secondary | ICD-10-CM | POA: Diagnosis not present

## 2021-10-25 DIAGNOSIS — Z6829 Body mass index (BMI) 29.0-29.9, adult: Secondary | ICD-10-CM | POA: Diagnosis not present

## 2021-10-25 DIAGNOSIS — Z124 Encounter for screening for malignant neoplasm of cervix: Secondary | ICD-10-CM | POA: Diagnosis not present

## 2021-10-25 DIAGNOSIS — Z1231 Encounter for screening mammogram for malignant neoplasm of breast: Secondary | ICD-10-CM | POA: Diagnosis not present

## 2021-10-25 DIAGNOSIS — Z1151 Encounter for screening for human papillomavirus (HPV): Secondary | ICD-10-CM | POA: Diagnosis not present

## 2021-10-25 DIAGNOSIS — Z01419 Encounter for gynecological examination (general) (routine) without abnormal findings: Secondary | ICD-10-CM | POA: Diagnosis not present

## 2021-12-16 DIAGNOSIS — C44329 Squamous cell carcinoma of skin of other parts of face: Secondary | ICD-10-CM | POA: Diagnosis not present

## 2022-01-06 ENCOUNTER — Ambulatory Visit (INDEPENDENT_AMBULATORY_CARE_PROVIDER_SITE_OTHER): Payer: BLUE CROSS/BLUE SHIELD | Admitting: Podiatry

## 2022-01-06 DIAGNOSIS — M21619 Bunion of unspecified foot: Secondary | ICD-10-CM

## 2022-01-06 DIAGNOSIS — B351 Tinea unguium: Secondary | ICD-10-CM

## 2022-01-06 MED ORDER — TERBINAFINE HCL 250 MG PO TABS: ORAL_TABLET | ORAL | 0 refills | Status: AC

## 2022-01-06 NOTE — Progress Notes (Signed)
Subjective:   Patient ID: Adrienne Hayes, female   DOB: 56 y.o.   MRN: [id number]   HPI Patient presents stating that she does have some discoloration of her nail bed right hallux.  He states that it is improved from of the treatment previously but still gives her some trouble.  Patient's structural bunion correction is good mild bunion deformity left does not smoke likes to be active   Review of Systems  All other systems reviewed and are negative.       Objective:  Physical Exam Vitals and nursing note reviewed.  Constitutional:      Appearance: She is well-developed.  Pulmonary:     Effort: Pulmonary effort is normal.  Musculoskeletal:        General: Normal range of motion.  Skin:    General: Skin is warm.  Neurological:     Mental Status: She is alert.     Neurovascular status found to be intact muscle strength found to be adequate range of motion adequate.  The nailbeds distal two thirds of the bed right over left hallux with slight other nails is present localized no proximal issues noted mild bunion deformity left excellent correction right     Assessment:  Localized mycotic nail infection with trauma also precipitating factor along with bunion deformity mild left foot well-healed deformity right     Plan:  H&P reviewed condition and again to take a progressive approach to try to help her without having to be full antifungal and patient at this time is placed on Lamisil pulse pack 250 mg take 1 for 7 days followed by month in between.  Also went ahead today and recommended laser and will have approximate 3 laser treatments

## 2022-01-20 DIAGNOSIS — I10 Essential (primary) hypertension: Secondary | ICD-10-CM | POA: Diagnosis not present

## 2022-01-20 DIAGNOSIS — E785 Hyperlipidemia, unspecified: Secondary | ICD-10-CM | POA: Diagnosis not present

## 2022-01-20 DIAGNOSIS — D509 Iron deficiency anemia, unspecified: Secondary | ICD-10-CM | POA: Diagnosis not present

## 2022-01-27 DIAGNOSIS — Z23 Encounter for immunization: Secondary | ICD-10-CM | POA: Diagnosis not present

## 2022-01-27 DIAGNOSIS — I10 Essential (primary) hypertension: Secondary | ICD-10-CM | POA: Diagnosis not present

## 2022-01-27 DIAGNOSIS — R82998 Other abnormal findings in urine: Secondary | ICD-10-CM | POA: Diagnosis not present

## 2022-01-27 DIAGNOSIS — Z0000 Encounter for general adult medical examination without abnormal findings: Secondary | ICD-10-CM | POA: Diagnosis not present

## 2022-02-21 DIAGNOSIS — C4402 Squamous cell carcinoma of skin of lip: Secondary | ICD-10-CM | POA: Diagnosis not present

## 2022-02-24 DIAGNOSIS — D224 Melanocytic nevi of scalp and neck: Secondary | ICD-10-CM | POA: Diagnosis not present

## 2022-02-24 DIAGNOSIS — D225 Melanocytic nevi of trunk: Secondary | ICD-10-CM | POA: Diagnosis not present

## 2022-02-24 DIAGNOSIS — D2261 Melanocytic nevi of right upper limb, including shoulder: Secondary | ICD-10-CM | POA: Diagnosis not present

## 2022-02-24 DIAGNOSIS — Z85828 Personal history of other malignant neoplasm of skin: Secondary | ICD-10-CM | POA: Diagnosis not present

## 2022-02-25 ENCOUNTER — Other Ambulatory Visit: Payer: BC Managed Care – PPO

## 2022-03-02 ENCOUNTER — Ambulatory Visit (INDEPENDENT_AMBULATORY_CARE_PROVIDER_SITE_OTHER): Payer: BC Managed Care – PPO | Admitting: Neurology

## 2022-03-02 ENCOUNTER — Encounter: Payer: Self-pay | Admitting: Neurology

## 2022-03-02 VITALS — BP 132/75 | HR 79 | Ht 59.0 in | Wt 149.0 lb

## 2022-03-02 DIAGNOSIS — R0683 Snoring: Secondary | ICD-10-CM | POA: Diagnosis not present

## 2022-03-02 DIAGNOSIS — G4733 Obstructive sleep apnea (adult) (pediatric): Secondary | ICD-10-CM

## 2022-03-02 DIAGNOSIS — R5382 Chronic fatigue, unspecified: Secondary | ICD-10-CM

## 2022-03-02 NOTE — Progress Notes (Signed)
SLEEP MEDICINE CLINIC    Provider:  Larey Seat, MD  Primary Care Physician:  Donnajean Lopes, [place] [address]     Referring Provider: Donnajean Lopes, [place],  [place] [address]          Chief Complaint according to patient   Patient presents with:     New Patient (Initial Visit)           HISTORY OF PRESENT ILLNESS:  Adrienne Hayes is a 57 y.o. female patient who is seen upon referral on 03/02/2022 from Dr Philip Aspen for a Sleep Consult .  Chief concern according to patient :  Here for sleep eval. Was seen here last in 2019 at which point was not sleeping well. Since then she is sleeping better. Her husband states she snores and convinced she has OSA.  States that she avg 6-7 hrs of sleep a night (sometimes more) states during the daytime drinks a lot of caffeine/ energy drinks  during the daytime.  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 No longer having RLS and overall doubts insomnia - more concerned about snoring.       I have the pleasure of seeing Adrienne Hayes 03/02/22 a right-handed female returning for a new evaluation of a  possible sleep disorder.    The patient had the first sleep study in the year 2019 with a result of an AHI ( Apnea Hypopnea index)  of the patient underwent a polysomnography at the time to see if she suffers from.  Limb movements.  At the time she was sleeping restless, had vivid dreams moved a lot in her sleep and woke up unrefreshed.  She had abnormal low iron capacity and this explained part of the restless leg component.  At that time she had been in stomach for about 12 months.  She had endorsed the Epworth Sleepiness Scale in 2019 at 20 out of 24 points the fatigue severity score was 40 out of 63 points.  Her BMI was 31.1 kg/m and her neck circumference was 15.5 inches.  The respiratory analysis showed very mild apnea her AHI was 5.4 but strongly exacerbated  during REM sleep.  Her REM AHI was 15.7/h and her supine AHI was 7.1/h this in comparison to a non-REM sleep AHI of 3.7/h and a nonsupine AHI of 2.2/h.  Her oxygen nadir was 80% the lowest and her time in hypoxia equaled only 7 minutes which was clinically not relevant.  She did have 368 periodic limb movements.  The patient's current medications are fairly similar to what she took in 2019 she is on losartan, amlodipine, Wellbutrin-bupropion and takes the 12-hour release in the morning.  She is also on a cholesterol-lowering medication rosuvastatin.  She is no longer taking fluticasone nasal spray and not on ropinirole anymore.    Sleep relevant medical history: Nocturia - once,  No ENT /Tonsillectomy or septal surgery, no metabolic  disorders.    Family medical /sleep history: no other biological  family member on CPAP with OSA. Brothers snore.  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  Family status is married , with adult son- [employer] rider  and Youth worker. She used to care for both elderly parents  The patient currently works  from her office at home. Tobacco use- none .  ETOH use ; none ,  Caffeine intake in form of Coffee( 1 in AM, some in pm ) Soda( 2-3) Tea (1-2/)  2 energy drinks a day. 4 caffeinated drinks a day.     Sleep habits are as follows:no more RLS.  The patient's dinner time is between 6-7 PM. The patient goes to bed at 11 PM and falls asleep within 30 minutes,  continues to sleep for 6-7 hours ( according to fit bit ) on weekends 8-9 hours- , wakes for 0-1 bathroom breaks. The preferred sleep position is lateral and supine - has hip pain- , with the support of 1-2 pillows.  Dreams are reportedly frequent/ wakens at 7.30 AM before  her alarm. 8.30  AM is the usual rise time. She reports not feeling refreshed or restored in AM, with symptoms such  as dry mouth, rarely with morning headaches, and some residual fatigue.  Naps are taken rarely now - infrequently.   Review of Systems: Out of a complete 14 system review, the patient complains of only the following symptoms, and all other reviewed systems are negative.:  Fatigue, sleepiness , snoring, fnon refreshing  sleep, no Insomnia, no RLS,    How likely are you to doze in the following situations: 0 = not likely, 1 = slight chance, 2 = moderate chance, 3 = high chance   Sitting and Reading? Watching Television? Sitting inactive in a public place (theater or meeting)? As a passenger in a car for an hour without a break? Lying down in the afternoon when circumstances permit? Sitting and talking to someone? Sitting quietly after lunch without alcohol? In a car, while stopped for a few minutes in traffic?   Total = 6/ 24 points   FSS endorsed at 50/ 63 points.    Social History   Socioeconomic History   Marital status: Married    Spouse name: Not on file   Number of children: 1   Years of education: Not on file   Highest education level: Not on file  Occupational History    Comment: home maker  Tobacco Use   Smoking status: Never   Smokeless tobacco: Never  Substance and Sexual Activity   Alcohol use: No   Drug use: No   Sexual activity: Yes    Birth control/protection: None  Other Topics Concern   Not on file  Social History Narrative   Caffeine 5+daily   Social Determinants of Health   Financial Resource Strain: Not on file  Food Insecurity: Not on file  Transportation Needs: Not on file  Physical Activity: Not on file  Stress: Not on file  Social Connections: Not on file    No family history on file.:  Father and mother deceased at age 36.mother has strokes.   1 sister and 3 brothers, 2 brothers with  HTN and snoring.   Past Medical History:  Diagnosis Date   Anemia    Hx   Hypertension    Insomnia     Past Surgical History:  Procedure  Laterality Date   benign tumor removed     x 2 in lower abd   CESAREAN SECTION     x 1   DILATATION & CURETTAGE/HYSTEROSCOPY WITH TRUECLEAR N/A 10/21/2013   Procedure: DILATATION & CURETTAGE/HYSTEROSCOPY WITH TRUCLEAR;  Surgeon: Margarette Asal, MD;  Location: [facility];  Service: Gynecology;  Laterality: N/A;   GUM SURGERY     WISDOM TOOTH  EXTRACTION       Current Outpatient Medications on File Prior to Visit  Medication Sig Dispense Refill   amLODipine (NORVASC) 2.5 MG tablet Take 1 tablet by mouth daily.  1   buPROPion ER (WELLBUTRIN SR) 100 MG 12 hr tablet Take 100 mg by mouth daily.     losartan (COZAAR) 100 MG tablet Take 1 tablet by mouth daily.  2   rosuvastatin (CRESTOR) 20 MG tablet Take 20 mg by mouth daily.     terbinafine (LAMISIL) 250 MG tablet Please take one a day x 7days, repeat every 4 weeks x 4 months 28 tablet 0   No current facility-administered medications on file prior to visit.    Allergies  Allergen Reactions   Iohexol      Desc: patient had hives,despite 13 hr prep/Dr Candise Che suggested patient receive 24 hr prep if contrast needed again      DIAGNOSTIC DATA (LABS, IMAGING, TESTING) - I reviewed patient records, labs, notes, testing and imaging myself where available.  Lab Results  Component Value Date   WBC 6.0 11/13/2017   HGB 12.8 11/13/2017   HCT 39.1 11/13/2017   MCV 86 11/13/2017   PLT 286 11/13/2017      Component Value Date/Time   NA 139 11/13/2017 1058   K 4.5 11/13/2017 1058   CL 101 11/13/2017 1058   CO2 23 11/13/2017 1058   GLUCOSE 85 11/13/2017 1058   GLUCOSE 86 10/17/2013 1400   BUN 15 11/13/2017 1058   CREATININE 0.93 11/13/2017 1058   CALCIUM 9.5 11/13/2017 1058   PROT 6.9 02/21/2018 1213   PROT 7.1 11/13/2017 1058   ALBUMIN 4.7 11/13/2017 1058   AST 10 02/21/2018 1213   ALT 12 02/21/2018 1213   ALKPHOS 93 11/13/2017 1058   BILITOT 0.9 02/21/2018 1213   BILITOT 0.8 11/13/2017 1058   GFRNONAA 70 11/13/2017 1058    GFRAA 81 11/13/2017 1058    Labs from Progress :  03-02-2022: The patient was last seen for a blood test on 27 January 2022, at the time she did have mild hyperlipidemia, she did not have any electrolyte abnormalities, normal CBC , TSH>   Gynecologist: Dr. Molli Posey, MD  He was looking at [facility] for fatigue as well.     PHYSICAL EXAM:  Today's Vitals   03/02/22 1257  BP: 132/75  Pulse: 79  Weight: 149 lb (67.6 kg)  Height: 4' 11"$  (1.499 m)   Body mass index is 30.09 kg/m.   Wt Readings from Last 3 Encounters:  03/02/22 149 lb (67.6 kg)  11/13/17 155 lb (70.3 kg)  10/17/13 136 lb (61.7 kg)     Ht Readings from Last 3 Encounters:  03/02/22 4' 11"$  (1.499 m)  11/13/17 4' 11"$  (1.499 m)  10/17/13 4' 9.5" (1.461 m)      General: The patient is awake, alert and appears not in acute distress. The patient is well groomed. Head: Normocephalic, atraumatic. Neck is supple.  Mallampati 2, red and  irritated looking.  neck circumference:15 inches . Nasal airflow patent.  Retrognathia is not seen.  Dental status: biological  Cardiovascular:  Regular rate and cardiac rhythm by pulse,  without distended neck veins. Respiratory: Lungs are clear to auscultation.  Skin:  With evidence of ankle edema, but not in AM Trunk: The patient's posture is relaxed   NEUROLOGIC EXAM: The patient is awake and alert, oriented to place and time.   Memory subjective described as intact.  Attention span &  concentration ability appears normal.  Speech is fluent,  with, dysphonia , not  aphasia.  Mood and affect are appropriate.   Cranial nerves: no loss of smell or taste reported  Pupils are equal and briskly reactive to light. Funduscopic exam deferred. .  Extraocular movements in vertical and horizontal planes were intact and without nystagmus. No Diplopia. Visual fields by finger perimetry are intact. Hearing was intact to soft voice and finger rubbing.    Facial sensation  intact to fine touch.  Facial motor strength is symmetric and tongue and uvula move midline.  Neck ROM : rotation, tilt and flexion extension were normal for age and shoulder shrug was symmetrical.  She tends to slump a bit.    Motor exam:  Symmetric bulk, tone and ROM.   Normal tone without cog wheeling, symmetric grip strength .   Sensory:  Fine touch and vibration were intact . Proprioception tested in the upper extremities was normal.   Coordination: Rapid alternating movements in the fingers/hands were of normal speed.  The Finger-to-nose maneuver was intact without evidence of ataxia, dysmetria or tremor.   Gait and station: Patient could rise unassisted from a seated position, walked without assistive device.  Stance is of normal width/ base and the patient turned with  steps.  Toe and heel walk were deferred.  Deep tendon reflexes: in the  upper and lower extremities are symmetric 1 plus and intact.  Babinski response was deferred  normal     ASSESSMENT AND PLAN 58 y.o. year old female  here with:    1) chronic fatigue , rather than sleepiness.  Hydrate - caffeine reduction and drink more water.   No longer reporting much difficulties to go to sleep or stay asleep. Fatigue related vitamins and metabolic tests were negative. Weight is stable.   2) insomnia is now resolved. No need for sleep aid.   3) loud snoring , especially in spine sleep position and witnessed apneas per spouse.  Her last sleep study in 2019 reported mild OSA with REM dependency.   I need to repeat a sleep study, but will use a HST due to patients financial concerns with high deductible.    I plan to follow up either personally or through our NP within 3-5 months. We can also confer by my Chart about the results of the HST and the treatment options.   I would like to thank Molli Posey, MD  and Donnajean Lopes, Md [address] [place],  [place] [address] for allowing me to meet with and to take  care of this pleasant patient.   CC: I will share my notes with PCP.  After spending a total time of  35  minutes face to face and additional time for physical and neurologic examination, review of laboratory studies,  personal review of imaging studies, reports and results of other testing and review of referral information / records as far as provided in visit,   Electronically signed by: Larey Seat, MD 03/02/2022 1:09 PM  [facility] Board certified by The [employer] of Sleep Medicine and Diplomate of the [employer] of Sleep Medicine. Board certified In Neurology through the [place], Fellow of the [employer] of Neurology. Medical Director of [employer] [facility].

## 2022-03-02 NOTE — Patient Instructions (Addendum)
Fatigue If you have fatigue, you feel tired all the time and have a lack of energy or a lack of motivation. Fatigue may make it difficult to start or complete tasks because of exhaustion. Occasional or mild fatigue is often a normal response to activity or life. However, long-term (chronic) or extreme fatigue may be a symptom of a medical condition such as: Depression. Not having enough red blood cells or hemoglobin in the blood (anemia). A problem with a small gland located in the lower front part of the neck (thyroid disorder). Rheumatologic conditions. These are problems related to the body's defense system (immune system). Infections, especially certain viral infections. Fatigue can also lead to negative health outcomes over time. Follow these instructions at home: Medicines Take over-the-counter and prescription medicines only as told by your health care provider. Take a multivitamin if told by your health care provider. Do not use herbal or dietary supplements unless they are approved by your health care provider. Eating and drinking  Avoid heavy meals in the evening. Eat a well-balanced diet, which includes lean proteins, whole grains, plenty of fruits and vegetables, and low-fat dairy products. Avoid eating or drinking too many products with caffeine in them. Avoid alcohol. Drink enough fluid to keep your urine pale yellow. Activity  Exercise regularly, as told by your health care provider. Use or practice techniques to help you relax, such as yoga, tai chi, meditation, or massage therapy. Lifestyle Change situations that cause you stress. Try to keep your work and personal schedules in balance. Do not use recreational or illegal drugs. General instructions Monitor your fatigue for any changes. Go to bed and get up at the same time every day. Avoid fatigue by pacing yourself during the day and getting enough sleep at night. Maintain a healthy weight. Contact a health care  provider if: Your fatigue does not get better. You have a fever. You suddenly lose or gain weight. You have headaches. You have trouble falling asleep or sleeping through the night. You feel angry, guilty, anxious, or sad. You have swelling in your legs or another part of your body. Get help right away if: You feel confused, feel like you might faint, or faint. Your vision is blurry or you have a severe headache. You have severe pain in your abdomen, your back, or the area between your waist and hips (pelvis). You have chest pain, shortness of breath, or an irregular or fast heartbeat. You are unable to urinate, or you urinate less than normal. You have abnormal bleeding from the rectum, nose, lungs, nipples, or, if you are female, the vagina. You vomit blood. You have thoughts about hurting yourself or others. These symptoms may be an emergency. Get help right away. Call 911. Do not wait to see if the symptoms will go away. Do not drive yourself to the hospital. Get help right away if you feel like you may hurt yourself or others, or have thoughts about taking your own life. Go to your nearest emergency room or: Call 911. Call the National Suicide Prevention Lifeline at 1-800-273-8255 or 988. This is open 24 hours a day. Text the Crisis Text Line at 741741. Summary If you have fatigue, you feel tired all the time and have a lack of energy or a lack of motivation. Fatigue may make it difficult to start or complete tasks because of exhaustion. Long-term (chronic) or extreme fatigue may be a symptom of a medical condition. Exercise regularly, as told by your health care provider.   Change situations that cause you stress. Try to keep your work and personal schedules in balance. This information is not intended to replace advice given to you by your health care provider. Make sure you discuss any questions you have with your health care provider. Document Revised: 10/19/2020 Document  Reviewed: 10/19/2020 Elsevier Patient Education  2023 Elsevier Inc.  

## 2022-03-07 ENCOUNTER — Ambulatory Visit (INDEPENDENT_AMBULATORY_CARE_PROVIDER_SITE_OTHER): Payer: BC Managed Care – PPO

## 2022-03-07 DIAGNOSIS — B351 Tinea unguium: Secondary | ICD-10-CM

## 2022-03-07 NOTE — Progress Notes (Signed)
Patient presents today for the first laser treatment. Diagnosed with mycotic nail infection by Dr. Paulla Dolly .   Toenail most affected bilateral hallux.  All other systems are negative.  Nails were filed thin. Laser therapy was administered to bilateral hallux toenails 1-5 and patient tolerated the treatment well. All safety precautions were in place.   Single laser pass was done on non-affected nails.  Pt stated that she is also taking Lamisil.  Follow up in 4 weeks for laser # 2.  Picture of nails taken today to document visual progress

## 2022-03-29 ENCOUNTER — Telehealth: Payer: Self-pay

## 2022-03-29 NOTE — Telephone Encounter (Signed)
LVM for pt to call back to schedule sleep study.  

## 2022-04-04 NOTE — Telephone Encounter (Signed)
Patient Called back.  HST- [employer] Josem Kaufmann: [id number] (Exp. 03/18/22 to 05/16/22)   Patient is scheduled at [facility] for 04/26/22 at 11:30 AM.  Mailed packet to the patient.

## 2022-04-05 ENCOUNTER — Other Ambulatory Visit: Payer: BC Managed Care – PPO

## 2022-04-11 ENCOUNTER — Ambulatory Visit (INDEPENDENT_AMBULATORY_CARE_PROVIDER_SITE_OTHER): Payer: BC Managed Care – PPO | Admitting: *Deleted

## 2022-04-11 DIAGNOSIS — B351 Tinea unguium: Secondary | ICD-10-CM

## 2022-04-11 NOTE — Progress Notes (Signed)
Patient presents today for the 2nd laser treatment. Diagnosed with mycotic nail infection by Dr. Paulla Dolly .   Toenail most affected bilateral hallux.  All other systems are negative.  Nails were filed thin. Laser therapy was administered to bilateral hallux toenails 1-5 and patient tolerated the treatment well. All safety precautions were in place.   Single laser pass was done on non-affected nails.   She has one more month of lamisil to take.  She says that her nails are looking so good she would like to hold off on the lasers for now. She will call if she needs additional laser treatments.

## 2022-04-26 ENCOUNTER — Ambulatory Visit: Payer: BC Managed Care – PPO | Admitting: Neurology

## 2022-04-26 DIAGNOSIS — G4733 Obstructive sleep apnea (adult) (pediatric): Secondary | ICD-10-CM | POA: Diagnosis not present

## 2022-04-26 DIAGNOSIS — R0683 Snoring: Secondary | ICD-10-CM

## 2022-04-26 DIAGNOSIS — R5382 Chronic fatigue, unspecified: Secondary | ICD-10-CM

## 2022-04-28 NOTE — Progress Notes (Signed)
[facility]  Adrienne Hayes 57 year old female January 26, 1965   HOME SLEEP TEST REPORT ( by Watch PAT)   STUDY DATA:  04-28-2022 DOB:  1965/12/05 MRN: [id number]    ORDERING CLINICIAN: Melvyn Novas, MD  REFERRING CLINICIAN: Lottie Dawson Paterson,MD   CLINICAL INFORMATION/HISTORY: New Patient , seen 03-02-2022, per PCP for:  Fatigue.  The patient had been seen in 2019, at that time for Insomnia and EDS- she endorsed an Epworth sleepiness score of 20/ 24 points (!). Now presenting after her husband stated that she snores and he is convinced she has OSA.  Reports averaging 6-7 hours of  nocturnal sleep (sometimes more) but she also states that she drinks a lot of caffeine/ energy drinks during the daytime.  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  Mild apnea was recorded in 2019 , her AHI was only 5.4/h.  She is also no longer plagued by RLS - more concerned about snoring.     Epworth sleepiness score: 6 /24. FSS at 50/63 points.    BMI: 30.1 kg/m   Neck Circumference: 15"   FINDINGS:   Sleep Summary:   Total Recording Time (hours, min):   9 hours 4 minutes  Total Sleep Time (hours, min): 8 hours 26 minutes               Percent REM (%): 29%   Sleep latency was 20 minutes and REM sleep latency 56 minutes.   Wake after sleep onset time was 18 minutes.                                     Respiratory Indices (AASM) :   Calculated pAHI (per hour): 26.1/h                           REM pAHI: 46.8/h                                                NREM pAHI: 17.7/h                             Positional AHI:    The vast majority of sleep time was recorded in supine position, associated with an AHI of 28.2/h and an RDI of 31.6/h.  This was followed by about 40 minutes of prone sleep with an AHI of 0 and an RDI of 14/h.  Snoring reached a mean volume of 41 dB and was present for about 18% of recorded sleep time.  This is mild snoring.                                                Oxygen Saturation Statistics:    O2 Saturation Range (%): Between a nadir at 78% and a maximum saturation at 98% with a mean saturation of 93%  O2 Saturation (minutes) <89%:   6.5 minutes.        Pulse Rate Statistics:   Pulse Mean (bpm):   71 bpm              Pulse Range:   Between 55 and 96 bpm.  Please note that this home sleep test device can only indicate the heart rate but does not provide heart rhythm data.  This home sleep test is also not equipped to detect restless legs/PLMs, parasomnias  or seizures, and has a limited ability to detect central versus obstructive apnea.             IMPRESSION:  This HST confirms the presence of moderate severe obstructive sleep apnea.    There were brief but clinically significant periods of oxygen desaturation noted and these correlate very strongly to REM sleep phases. 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 This form and degree of apnea requires positive airway pressure therapy.   RECOMMENDATION: I would like for this patient to start on an auto titration CPAP device with a setting between 5 and 16 cm water pressure, 3 cm expiratory pressure relief, heated humidification will be provided.  And interface that is comfortable for the patient to use should be fitted in reclined position.  This patient likely will achieve a reduction in sleep apnea and hypopnea by avoiding supine sleep if that is possible.  There are several joint pain issues that may have forced the patient to sleep supine.  Weight loss is usually beneficial for apnea patients and often reduces the volume of snoring as well.    INTERPRETING PHYSICIAN:   Melvyn Novas, MD   Medical Director of [facility] at [facility].

## 2022-04-29 NOTE — Procedures (Signed)
    [facility]  Adrienne Hayes 57 year old female 08/20/1965   HOME SLEEP TEST REPORT ( by Watch PAT)   STUDY DATA:  04-28-2022 DOB:  03/01/1965 MRN: [id number]    ORDERING CLINICIAN: Khamya Topp, MD  REFERRING CLINICIAN: Daniel George Paterson,MD   CLINICAL INFORMATION/HISTORY: New Patient , seen 03-02-2022, per PCP for:  Fatigue.  The patient had been seen in 2019, at that time for Insomnia and EDS- she endorsed an Epworth sleepiness score of 20/ 24 points (!). Now presenting after her husband stated that she snores and he is convinced she has OSA.  Reports averaging 6-7 hours of  nocturnal sleep (sometimes more) but she also states that she drinks a lot of caffeine/ energy drinks during the daytime.  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  Mild apnea was recorded in 2019 , her AHI was only 5.4/h.  She is also no longer plagued by RLS - more concerned about snoring.     Epworth sleepiness score: 6 /24. FSS at 50/63 points.    BMI: 30.1 kg/m   Neck Circumference: 15"   FINDINGS:   Sleep Summary:   Total Recording Time (hours, min):   9 hours 4 minutes  Total Sleep Time (hours, min): 8 hours 26 minutes               Percent REM (%): 29%   Sleep latency was 20 minutes and REM sleep latency 56 minutes.   Wake after sleep onset time was 18 minutes.                                     Respiratory Indices (AASM) :   Calculated pAHI (per hour): 26.1/h                           REM pAHI: 46.8/h                                                NREM pAHI: 17.7/h                             Positional AHI:    The vast majority of sleep time was recorded in supine position, associated with an AHI of 28.2/h and an RDI of 31.6/h.  This was followed by about 40 minutes of prone sleep with an AHI of 0 and an RDI of 14/h.  Snoring reached a mean volume of 41 dB and was present for about 18% of recorded sleep time.  This is mild snoring.                                                Oxygen Saturation Statistics:    O2 Saturation Range (%): Between a nadir at 78% and a maximum saturation at 98% with a mean saturation of 93%                                       O2 Saturation (minutes) <89%:   6.5 minutes.        Pulse Rate Statistics:   Pulse Mean (bpm):   71 bpm              Pulse Range:   Between 55 and 96 bpm.  Please note that this home sleep test device can only indicate the heart rate but does not provide heart rhythm data.  This home sleep test is also not equipped to detect restless legs/PLMs, parasomnias  or seizures, and has a limited ability to detect central versus obstructive apnea.             IMPRESSION:  This HST confirms the presence of moderate severe obstructive sleep apnea.    There were brief but clinically significant periods of oxygen desaturation noted and these correlate very strongly to REM sleep phases. 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 This form and degree of apnea requires positive airway pressure therapy.   RECOMMENDATION: I would like for this patient to start on an auto titration CPAP device with a setting between 5 and 16 cm water pressure, 3 cm expiratory pressure relief, heated humidification will be provided.  And interface that is comfortable for the patient to use should be fitted in reclined position.  This patient likely will achieve a reduction in sleep apnea and hypopnea by avoiding supine sleep if that is possible.  There are several joint pain issues that may have forced the patient to sleep supine.  Weight loss is usually beneficial for apnea patients and often reduces the volume of snoring as well.    INTERPRETING PHYSICIAN:   Georgana Romain, MD   Medical Director of [facility].        

## 2022-04-29 NOTE — Addendum Note (Signed)
Addended by: Melvyn Novas on: 04/29/2022 02:44 PM   Modules accepted: Orders

## 2022-05-02 ENCOUNTER — Telehealth: Payer: Self-pay

## 2022-05-02 NOTE — Telephone Encounter (Signed)
I called pt. I advised pt that Dr. DohmeVickey Hugereviewed their sleep study results and found that pt has moderate to sever OSA . Dr. Vickey Huger recommends that pt starts AutoPAP. I reviewed PAP compliance expectations with the pt. Pt is agreeable to starting a CPAP. I advised pt that an order will be sent to a DME, [facility], and [facility] will call the pt within about one week after they file with the pt's insurance. [facility] will show the pt how to use the machine, fit for masks, and troubleshoot the CPAP if needed. A follow up appt was made for insurance purposes with Amy Lomax on 08/03/22 at 9am . Pt verbalized understanding to arrive 15 minutes early and bring their CPAP. Pt verbalized understanding of results. Pt had no questions at this time but was encouraged to call back if questions arise. I have sent the order to [facility] and have received confirmation that they have received the order.

## 2022-05-02 NOTE — Telephone Encounter (Signed)
-----   Message from Melvyn Novas, MD sent at 04/29/2022  2:44 PM EDT ----- There was a big difference between sleep study results from 2019 to now. She clearly has REM sleep dependent apnea and hypoxia.

## 2022-05-03 ENCOUNTER — Other Ambulatory Visit: Payer: Self-pay | Admitting: Podiatry

## 2022-05-03 NOTE — Telephone Encounter (Signed)
New, Maryruth Bun, Abbe Amsterdam, CMA; Tito Dine, Terrilyn Saver Received, Thank you!     ----- Message ----- From: Bobbye Morton, CMA Sent: 05/02/2022  10:57 AM EDT To: Henderson Newcomer; Kathyrn Sheriff; Santina Evans; * Subject: New AutoPAP                                    New orders have been placed for the above pt, DOB: [birth year] Thanks

## 2022-05-04 ENCOUNTER — Telehealth: Payer: Self-pay

## 2022-05-25 DIAGNOSIS — G4733 Obstructive sleep apnea (adult) (pediatric): Secondary | ICD-10-CM | POA: Diagnosis not present

## 2022-06-25 DIAGNOSIS — G4733 Obstructive sleep apnea (adult) (pediatric): Secondary | ICD-10-CM | POA: Diagnosis not present

## 2022-07-25 DIAGNOSIS — G4733 Obstructive sleep apnea (adult) (pediatric): Secondary | ICD-10-CM | POA: Diagnosis not present

## 2022-08-02 NOTE — Patient Instructions (Incomplete)

## 2022-08-02 NOTE — Progress Notes (Unsigned)
PATIENT: Adrienne Hayes DOB: 10-May-1965  REASON FOR VISIT: follow up HISTORY FROM: patient  No chief complaint on file.    HISTORY OF PRESENT ILLNESS:  08/02/22 ALL:  Adrienne Hayes is a 57 y.o. female here today for follow up for OSA on CPAP.  She was seen in consult with Dr Vickey Huger 02/2022 for concerns of snoring. 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 Anemic. PSG showed AHI 5.6/hr, REM 15/hr. She was given iron transfusion then advised oral supplements. HST 04/2022 showed AHI 26.1/h, REM 46.8/hr, O2 nadir 78%. AutoPAP advised.   Since,     HISTORY: (copied from Dr Dohmeier's previous note)  Adrienne Hayes is a 57 y.o. female patient who is seen upon referral on 03/02/2022 from Dr Eloise Harman for a Sleep Consult .  Chief concern according to patient :  Here for sleep eval. Was seen here last in 2019 at which point was not sleeping well. Since then she is sleeping better. Her husband states she snores and convinced she has OSA.  States that she avg 6-7 hrs of sleep a night (sometimes more) states during the daytime drinks a lot of caffeine/ energy drinks  during the daytime.  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 No longer having RLS and overall doubts insomnia - more concerned about snoring.   I have the pleasure of seeing Adrienne Hayes 03/02/22 a right-handed female returning for a new evaluation of a  possible sleep disorder.    The patient had the first sleep study in the year 2019 with a result of an AHI ( Apnea Hypopnea index)  of the patient underwent a polysomnography at the time to see if she suffers from.  Limb movements.  At the time she was sleeping restless, had vivid dreams moved a lot in her sleep and woke up unrefreshed.  She had abnormal low iron capacity and this explained part of the restless leg component.  At that time she had been in stomach for about 12 months.  She had endorsed the Epworth Sleepiness Scale in 2019 at  20 out of 24 points the fatigue severity score was 40 out of 63 points.  Her BMI was 31.1 kg/m and her neck circumference was 15.5 inches.  The respiratory analysis showed very mild apnea her AHI was 5.4 but strongly exacerbated during REM sleep.  Her REM AHI was 15.7/h and her supine AHI was 7.1/h this in comparison to a non-REM sleep AHI of 3.7/h and a nonsupine AHI of 2.2/h.  Her oxygen nadir was 80% the lowest and her time in hypoxia equaled only 7 minutes which was clinically not relevant.  She did have 368 periodic limb movements.   The patient's current medications are fairly similar to what she took in 2019 she is on losartan, amlodipine, Wellbutrin-bupropion and takes the 12-hour release in the morning.  She is also on a cholesterol-lowering medication rosuvastatin.  She is no longer taking fluticasone nasal spray and not on ropinirole anymore.   Sleep relevant medical history: Nocturia - once,  No ENT /Tonsillectomy or septal surgery, no metabolic  disorders.    Family medical /sleep history: no other biological  family member on CPAP with OSA. Brothers snore.  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  Family  status is married , with adult son- Adrienne Hayes  and Dance movement psychotherapist. She used to care for both elderly parents  The patient currently works from her office at home. Tobacco use- none .  ETOH use ; none ,  Caffeine intake in form of Coffee( 1 in AM, some in pm ) Soda( 2-3) Tea (1-2/)  2 energy drinks a day. 4 caffeinated drinks a day.    Sleep habits are as follows:no more RLS.  The patient's dinner time is between 6-7 PM. The patient goes to bed at 11 PM and falls asleep within 30 minutes,  continues to sleep for 6-7 hours ( according to fit bit ) on weekends 8-9 hours- , wakes for 0-1 bathroom breaks. The preferred sleep position is lateral and supine -  has hip pain- , with the support of 1-2 pillows.  Dreams are reportedly frequent/ wakens at 7.30 AM before  her alarm. 8.30  AM is the usual rise time. She reports not feeling refreshed or restored in AM, with symptoms such as dry mouth, rarely with morning headaches, and some residual fatigue.  Naps are taken rarely now - infrequently.   REVIEW OF SYSTEMS: Out of a complete 14 system review of symptoms, the patient complains only of the following symptoms, and all other reviewed systems are negative.  ESS:  ALLERGIES: Allergies  Allergen Reactions   Iohexol      Desc: patient had hives,despite 13 hr prep/Dr Pecolia Ades suggested patient receive 24 hr prep if contrast needed again     HOME MEDICATIONS: Outpatient Medications Prior to Visit  Medication Sig Dispense Refill   amLODipine (NORVASC) 2.5 MG tablet Take 1 tablet by mouth daily.  1   buPROPion ER (WELLBUTRIN SR) 100 MG 12 hr tablet Take 100 mg by mouth daily.     losartan (COZAAR) 100 MG tablet Take 1 tablet by mouth daily.  2   rosuvastatin (CRESTOR) 20 MG tablet Take 20 mg by mouth daily.     terbinafine (LAMISIL) 250 MG tablet Please take one a day x 7days, repeat every 4 weeks x 4 months 28 tablet 0   No facility-administered medications prior to visit.    PAST MEDICAL HISTORY: Past Medical History:  Diagnosis Date   Anemia    Hx   Hypertension    Insomnia     PAST SURGICAL HISTORY: Past Surgical History:  Procedure Laterality Date   benign tumor removed     x 2 in lower abd   CESAREAN SECTION     x 1   DILATATION & CURETTAGE/HYSTEROSCOPY WITH TRUECLEAR N/A 10/21/2013   Procedure: DILATATION & CURETTAGE/HYSTEROSCOPY WITH TRUCLEAR;  Surgeon: Meriel Pica, MD;  Location: [facility];  Service: Gynecology;  Laterality: N/A;   GUM SURGERY     WISDOM TOOTH EXTRACTION      FAMILY HISTORY: No family history on file.  SOCIAL HISTORY: Social History   Socioeconomic History   Marital status: Married     Spouse name: Not on file   Number of children: 1   Years of education: Not on file   Highest education level: Not on file  Occupational History    Comment: home maker  Tobacco Use   Smoking status: Never   Smokeless tobacco: Never  Substance and Sexual Activity   Alcohol use: No   Drug use: No   Sexual activity: Yes    Birth control/protection: None  Other Topics Concern   Not on file  Social History Narrative  Caffeine 5+daily   Social Determinants of Health   Financial Resource Strain: Not on file  Food Insecurity: Not on file  Transportation Needs: Not on file  Physical Activity: Not on file  Stress: Not on file  Social Connections: Not on file  Intimate Partner Violence: Not on file     PHYSICAL EXAM  There were no vitals filed for this visit. There is no height or weight on file to calculate BMI.  Generalized: Well developed, in no acute distress  Cardiology: normal rate and rhythm, no murmur noted Respiratory: clear to auscultation bilaterally  Neurological examination  Mentation: Alert oriented to time, place, history taking. Follows all commands speech and language fluent Cranial nerve II-XII: Pupils were equal round reactive to light. Extraocular movements were full, visual field were full  Motor: The motor testing reveals 5 over 5 strength of all 4 extremities. Good symmetric motor tone is noted throughout.  Gait and station: Gait is normal.    DIAGNOSTIC DATA (LABS, IMAGING, TESTING) - I reviewed patient records, labs, notes, testing and imaging myself where available.      No data to display           Lab Results  Component Value Date   WBC 6.0 11/13/2017   HGB 12.8 11/13/2017   HCT 39.1 11/13/2017   MCV 86 11/13/2017   PLT 286 11/13/2017      Component Value Date/Time   NA 139 11/13/2017 1058   K 4.5 11/13/2017 1058   CL 101 11/13/2017 1058   CO2 23 11/13/2017 1058   GLUCOSE 85 11/13/2017 1058   GLUCOSE 86 10/17/2013 1400   BUN 15  11/13/2017 1058   CREATININE 0.93 11/13/2017 1058   CALCIUM 9.5 11/13/2017 1058   PROT 6.9 02/21/2018 1213   PROT 7.1 11/13/2017 1058   ALBUMIN 4.7 11/13/2017 1058   AST 10 02/21/2018 1213   ALT 12 02/21/2018 1213   ALKPHOS 93 11/13/2017 1058   BILITOT 0.9 02/21/2018 1213   BILITOT 0.8 11/13/2017 1058   GFRNONAA 70 11/13/2017 1058   GFRAA 81 11/13/2017 1058   No results found for: "CHOL", "HDL", "LDLCALC", "LDLDIRECT", "TRIG", "CHOLHDL" No results found for: "HGBA1C" No results found for: "VITAMINB12" No results found for: "TSH"   ASSESSMENT AND PLAN 57 y.o. year old female  has a past medical history of Anemia, Hypertension, and Insomnia. here with   No diagnosis found.    Adrienne Hayes is doing well on CPAP therapy. Compliance report reveals ***. *** was encouraged to continue using CPAP nightly and for greater than 4 hours each night. We will update supply orders as indicated. Risks of untreated sleep apnea review and education materials provided. Healthy lifestyle habits encouraged. *** will follow up in ***, sooner if needed. *** verbalizes understanding and agreement with this plan.    No orders of the defined types were placed in this encounter.    No orders of the defined types were placed in this encounter.     Shawnie Dapper, FNP-C 08/02/2022, 10:38 AM [facility] [address] [place], [place] [address] [phone]

## 2022-08-03 ENCOUNTER — Encounter: Payer: Self-pay | Admitting: Family Medicine

## 2022-08-03 ENCOUNTER — Ambulatory Visit (INDEPENDENT_AMBULATORY_CARE_PROVIDER_SITE_OTHER): Payer: BC Managed Care – PPO | Admitting: Family Medicine

## 2022-08-03 VITALS — BP 109/69 | HR 79 | Ht <= 58 in | Wt 150.5 lb

## 2022-08-03 DIAGNOSIS — G4733 Obstructive sleep apnea (adult) (pediatric): Secondary | ICD-10-CM | POA: Diagnosis not present

## 2022-08-25 DIAGNOSIS — G4733 Obstructive sleep apnea (adult) (pediatric): Secondary | ICD-10-CM | POA: Diagnosis not present

## 2022-08-26 DIAGNOSIS — G4733 Obstructive sleep apnea (adult) (pediatric): Secondary | ICD-10-CM | POA: Diagnosis not present

## 2022-09-17 ENCOUNTER — Telehealth: Payer: Self-pay | Admitting: Family Medicine

## 2022-09-17 NOTE — Telephone Encounter (Signed)
Can you please attach 90 day compliance report? TY!

## 2022-09-25 DIAGNOSIS — G4733 Obstructive sleep apnea (adult) (pediatric): Secondary | ICD-10-CM | POA: Diagnosis not present

## 2022-09-26 DIAGNOSIS — G4733 Obstructive sleep apnea (adult) (pediatric): Secondary | ICD-10-CM | POA: Diagnosis not present

## 2022-10-05 DIAGNOSIS — G4733 Obstructive sleep apnea (adult) (pediatric): Secondary | ICD-10-CM | POA: Diagnosis not present

## 2022-10-25 DIAGNOSIS — H9042 Sensorineural hearing loss, unilateral, left ear, with unrestricted hearing on the contralateral side: Secondary | ICD-10-CM | POA: Diagnosis not present

## 2022-10-25 DIAGNOSIS — G4733 Obstructive sleep apnea (adult) (pediatric): Secondary | ICD-10-CM | POA: Diagnosis not present

## 2022-10-26 DIAGNOSIS — G4733 Obstructive sleep apnea (adult) (pediatric): Secondary | ICD-10-CM | POA: Diagnosis not present

## 2022-11-04 DIAGNOSIS — G4733 Obstructive sleep apnea (adult) (pediatric): Secondary | ICD-10-CM | POA: Diagnosis not present

## 2022-11-25 DIAGNOSIS — G4733 Obstructive sleep apnea (adult) (pediatric): Secondary | ICD-10-CM | POA: Diagnosis not present

## 2022-12-05 DIAGNOSIS — G4733 Obstructive sleep apnea (adult) (pediatric): Secondary | ICD-10-CM | POA: Diagnosis not present

## 2022-12-25 DIAGNOSIS — G4733 Obstructive sleep apnea (adult) (pediatric): Secondary | ICD-10-CM | POA: Diagnosis not present

## 2023-01-02 DIAGNOSIS — Z01419 Encounter for gynecological examination (general) (routine) without abnormal findings: Secondary | ICD-10-CM | POA: Diagnosis not present

## 2023-01-02 DIAGNOSIS — R319 Hematuria, unspecified: Secondary | ICD-10-CM | POA: Diagnosis not present

## 2023-01-02 DIAGNOSIS — Z1231 Encounter for screening mammogram for malignant neoplasm of breast: Secondary | ICD-10-CM | POA: Diagnosis not present

## 2023-01-02 DIAGNOSIS — Z6830 Body mass index (BMI) 30.0-30.9, adult: Secondary | ICD-10-CM | POA: Diagnosis not present

## 2023-01-05 DIAGNOSIS — G4733 Obstructive sleep apnea (adult) (pediatric): Secondary | ICD-10-CM | POA: Diagnosis not present

## 2023-01-10 ENCOUNTER — Other Ambulatory Visit: Payer: Self-pay | Admitting: Obstetrics and Gynecology

## 2023-01-10 DIAGNOSIS — R928 Other abnormal and inconclusive findings on diagnostic imaging of breast: Secondary | ICD-10-CM

## 2023-01-13 ENCOUNTER — Ambulatory Visit
Admission: RE | Admit: 2023-01-13 | Discharge: 2023-01-13 | Disposition: A | Discharge status: Home / Self Care | Payer: BC Managed Care – PPO | Source: Ambulatory Visit | Attending: Obstetrics and Gynecology | Admitting: Obstetrics and Gynecology

## 2023-01-13 ENCOUNTER — Other Ambulatory Visit: Payer: Self-pay | Admitting: Obstetrics and Gynecology

## 2023-01-13 DIAGNOSIS — R928 Other abnormal and inconclusive findings on diagnostic imaging of breast: Secondary | ICD-10-CM

## 2023-01-13 DIAGNOSIS — R921 Mammographic calcification found on diagnostic imaging of breast: Secondary | ICD-10-CM

## 2023-01-25 DIAGNOSIS — G4733 Obstructive sleep apnea (adult) (pediatric): Secondary | ICD-10-CM | POA: Diagnosis not present

## 2023-02-05 DIAGNOSIS — G4733 Obstructive sleep apnea (adult) (pediatric): Secondary | ICD-10-CM | POA: Diagnosis not present

## 2023-02-07 NOTE — Progress Notes (Unsigned)
Marland Kitchen

## 2023-02-08 ENCOUNTER — Ambulatory Visit (INDEPENDENT_AMBULATORY_CARE_PROVIDER_SITE_OTHER): Payer: BC Managed Care – PPO | Admitting: Family Medicine

## 2023-02-08 ENCOUNTER — Encounter: Payer: Self-pay | Admitting: Family Medicine

## 2023-02-08 VITALS — BP 121/77 | HR 80 | Ht <= 58 in | Wt 150.5 lb

## 2023-02-08 DIAGNOSIS — G4733 Obstructive sleep apnea (adult) (pediatric): Secondary | ICD-10-CM

## 2023-02-08 NOTE — Patient Instructions (Addendum)
Please continue using your CPAP regularly. While your insurance requires that you use CPAP at least 4 hours each night on 70% of the nights, I recommend, that you not skip any nights and use it throughout the night if you can. Getting used to CPAP and staying with the treatment long term does take time and patience and discipline. Untreated obstructive sleep apnea when it is moderate to severe can have an adverse impact on cardiovascular health and raise her risk for heart disease, arrhythmias, hypertension, congestive heart failure, stroke and diabetes. Untreated obstructive sleep apnea causes sleep disruption, nonrestorative sleep, and sleep deprivation. This can have an impact on your day to day functioning and cause daytime sleepiness and impairment of cognitive function, memory loss, mood disturbance, and problems focussing. Using CPAP regularly can improve these symptoms.  We will update supply orders, today. I have sent orders to [employer] to request they fir you for a memory foam style mask that sits under the nose and covers the mask.   Follow up in 1 year

## 2023-02-08 NOTE — Progress Notes (Signed)
PATIENT: Adrienne Hayes DOB: 1966-01-02  REASON FOR VISIT: follow up HISTORY FROM: patient  Chief Complaint  Patient presents with   Room 1    Pt is here Alone. Pt states that she hates her machine. Pt states that she is having a lot of leaks with her machine. Pt states that she is sleeping better, but doesn't feel well rested.      HISTORY OF PRESENT ILLNESS:  02/08/23 ALL:  Adrienne Hayes returns for follow up for OSA on CPAP. She was last seen 07/2022. We increased max pressure to 17cmH20 as AHI remained slightly elevated at 5.2/h. Since, she reports doing fairly well. She is using therapy nightly for about 7 hours. She is not sure she notes any significant benefit from using CPAP. She notes that mask makes noises during the night and this disrupts her husband. She continues to have fatigue. She lost her sister unexpectedly in 12/2022. She has not been exercising as much. She is scheduled to see PCP next month.     08/03/2022 ALL:  Adrienne Hayes is a 58 y.o. female here today for follow up for OSA on CPAP.  She was seen in consult with Dr Vickey Huger 02/2022 for concerns of snoring. 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 Anemic. PSG showed AHI 5.6/hr, REM 15/hr. She was given iron transfusion then advised oral supplements. HST 04/2022 showed AHI 26.1/h, REM 46.8/hr, O2 nadir 78%. AutoPAP advised.   Since, he reports doing fairly well. She is using therapy most every night for about 7-8 hours, on average. She does not like current mask. She was using a traditional FFM in a small but recently received a medium. She feels this is a better fit. She was using the dream wear style mask but felt it leaked. She has not noted any significant benefit of using CPAP. She reports that her husband tells her she is still snoring. She is aware of health benefits and wishes to continue use.     HISTORY: (copied from Dr Dohmeier's previous note)  Adrienne Hayes is a 58 y.o. female patient who is  seen upon referral on 03/02/2022 from Dr Eloise Harman for a Sleep Consult .  Chief concern according to patient :  Here for sleep eval. Was seen here last in 2019 at which point was not sleeping well. Since then she is sleeping better. Her husband states she snores and convinced she has OSA.  States that she avg 6-7 hrs of sleep a night (sometimes more) states during the daytime drinks a lot of caffeine/ energy drinks  during the daytime.  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 No longer having RLS and overall doubts insomnia - more concerned about snoring.   I have the pleasure of seeing SHAHAD MAZUREK 03/02/22 a right-handed female returning for a new evaluation of a  possible sleep disorder.    The patient had the first sleep study in the year 2019 with a result of an AHI ( Apnea Hypopnea index)  of the patient underwent a polysomnography at the time to see if she suffers from.  Limb movements.  At the time she was sleeping restless, had vivid dreams moved a lot in her sleep and woke up unrefreshed.  She had abnormal low iron capacity and this explained part of the restless leg component.  At that time she had been in stomach for about 12 months.  She had endorsed the Epworth  Sleepiness Scale in 2019 at 20 out of 24 points the fatigue severity score was 40 out of 63 points.  Her BMI was 31.1 kg/m and her neck circumference was 15.5 inches.  The respiratory analysis showed very mild apnea her AHI was 5.4 but strongly exacerbated during REM sleep.  Her REM AHI was 15.7/h and her supine AHI was 7.1/h this in comparison to a non-REM sleep AHI of 3.7/h and a nonsupine AHI of 2.2/h.  Her oxygen nadir was 80% the lowest and her time in hypoxia equaled only 7 minutes which was clinically not relevant.  She did have 368 periodic limb movements.   The patient's current medications are fairly similar to what she took in 2019 she is on losartan, amlodipine, Wellbutrin-bupropion and takes  the 12-hour release in the morning.  She is also on a cholesterol-lowering medication rosuvastatin.  She is no longer taking fluticasone nasal spray and not on ropinirole anymore.   Sleep relevant medical history: Nocturia - once,  No ENT /Tonsillectomy or septal surgery, no metabolic  disorders.    Family medical /sleep history: no other biological  family member on CPAP with OSA. Brothers snore.  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  Family status is married , with adult son- Adrienne Hayes rider  and Dance movement psychotherapist. She used to care for both elderly parents  The patient currently works from her office at home. Tobacco use- none .  ETOH use ; none ,  Caffeine intake in form of Coffee( 1 in AM, some in pm ) Soda( 2-3) Tea (1-2/)  2 energy drinks a day. 4 caffeinated drinks a day.    Sleep habits are as follows:no more RLS.  The patient's dinner time is between 6-7 PM. The patient goes to bed at 11 PM and falls asleep within 30 minutes,  continues to sleep for 6-7 hours ( according to fit bit ) on weekends 8-9 hours- , wakes for 0-1 bathroom breaks. The preferred sleep position is lateral and supine - has hip pain- , with the support of 1-2 pillows.  Dreams are reportedly frequent/ wakens at 7.30 AM before  her alarm. 8.30  AM is the usual rise time. She reports not feeling refreshed or restored in AM, with symptoms such as dry mouth, rarely with morning headaches, and some residual fatigue.  Naps are taken rarely now - infrequently.   REVIEW OF SYSTEMS: Out of a complete 14 system review of symptoms, the patient complains only of the following symptoms, fatigue and all other reviewed systems are negative.  ESS: 9/24, previously 15/24 FSS 49/63, prev 50/63  ALLERGIES: Allergies  Allergen Reactions   Iohexol      Desc: patient had hives,despite 13 hr prep/Dr  Pecolia Ades suggested patient receive 24 hr prep if contrast needed again     HOME MEDICATIONS: Outpatient Medications Prior to Visit  Medication Sig Dispense Refill   amLODipine (NORVASC) 2.5 MG tablet Take 1 tablet by mouth daily.  1   buPROPion ER (WELLBUTRIN SR) 100 MG 12 hr tablet Take 100 mg by mouth daily.     losartan (COZAAR) 100 MG tablet Take 1 tablet by mouth daily.  2   rosuvastatin (CRESTOR) 20 MG tablet Take 20 mg by mouth daily.     terbinafine (LAMISIL) 250 MG tablet Please take one a day x  7days, repeat every 4 weeks x 4 months (Patient not taking: Reported on 02/08/2023) 28 tablet 0   No facility-administered medications prior to visit.    PAST MEDICAL HISTORY: Past Medical History:  Diagnosis Date   Anemia    Hx   Hypertension    Insomnia     PAST SURGICAL HISTORY: Past Surgical History:  Procedure Laterality Date   benign tumor removed     x 2 in lower abd   CESAREAN SECTION     x 1   DILATATION & CURETTAGE/HYSTEROSCOPY WITH TRUECLEAR N/A 10/21/2013   Procedure: DILATATION & CURETTAGE/HYSTEROSCOPY WITH TRUCLEAR;  Surgeon: Meriel Pica, MD;  Location: [facility];  Service: Gynecology;  Laterality: N/A;   GUM SURGERY     WISDOM TOOTH EXTRACTION      FAMILY HISTORY: History reviewed. No pertinent family history.  SOCIAL HISTORY: Social History   Socioeconomic History   Marital status: Married    Spouse name: Not on file   Number of children: 1   Years of education: Not on file   Highest education level: Not on file  Occupational History    Comment: home maker  Tobacco Use   Smoking status: Never   Smokeless tobacco: Never  Substance and Sexual Activity   Alcohol use: No   Drug use: No   Sexual activity: Yes    Birth control/protection: None  Other Topics Concern   Not on file  Social History Narrative   Caffeine 5+daily   Social Drivers of Corporate investment banker Strain: Not on file  Food Insecurity: Not on file  Transportation  Needs: Not on file  Physical Activity: Not on file  Stress: Not on file  Social Connections: Not on file  Intimate Partner Violence: Not on file     PHYSICAL EXAM  Vitals:   02/08/23 1328  BP: 121/77  Pulse: 80  Weight: 150 lb 8 oz (68.3 kg)  Height: 4\' 10"  (1.473 m)    Body mass index is 31.45 kg/m.  Generalized: Well developed, in no acute distress  Cardiology: normal rate and rhythm, no murmur noted Respiratory: clear to auscultation bilaterally  Neurological examination  Mentation: Alert oriented to time, place, history taking. Follows all commands speech and language fluent Cranial nerve II-XII: Pupils were equal round reactive to light. Extraocular movements were full, visual field were full  Motor: The motor testing reveals 5 over 5 strength of all 4 extremities. Good symmetric motor tone is noted throughout.  Gait and station: Gait is normal.    DIAGNOSTIC DATA (LABS, IMAGING, TESTING) - I reviewed patient records, labs, notes, testing and imaging myself where available.      No data to display           Lab Results  Component Value Date   WBC 6.0 11/13/2017   HGB 12.8 11/13/2017   HCT 39.1 11/13/2017   MCV 86 11/13/2017   PLT 286 11/13/2017      Component Value Date/Time   NA 139 11/13/2017 1058   K 4.5 11/13/2017 1058   CL 101 11/13/2017 1058   CO2 23 11/13/2017 1058   GLUCOSE 85 11/13/2017 1058   GLUCOSE 86 10/17/2013 1400   BUN 15 11/13/2017 1058   CREATININE 0.93 11/13/2017 1058   CALCIUM 9.5 11/13/2017 1058   PROT 6.9 02/21/2018 1213   PROT 7.1 11/13/2017 1058   ALBUMIN 4.7 11/13/2017 1058   AST 10 02/21/2018 1213   ALT 12 02/21/2018 1213   ALKPHOS  93 11/13/2017 1058   BILITOT 0.9 02/21/2018 1213   BILITOT 0.8 11/13/2017 1058   GFRNONAA 70 11/13/2017 1058   GFRAA 81 11/13/2017 1058   No results found for: "CHOL", "HDL", "LDLCALC", "LDLDIRECT", "TRIG", "CHOLHDL" No results found for: "HGBA1C" No results found for: "VITAMINB12" No  results found for: "TSH"   ASSESSMENT AND PLAN 58 y.o. year old female  has a past medical history of Anemia, Hypertension, and Insomnia. here with     ICD-10-CM   1. OSA on CPAP  G47.33 For home use only DME continuous positive airway pressure (CPAP)        KAVERI PERRAS is doing well on CPAP therapy. Compliance report reveals excellent compliance. AHI slightly elevated at 5.1/hr. I have placed mask refitting orders. She was advised to consider a memory foam/dream wear style mask. She was encouraged to continue using CPAP nightly and for greater than 4 hours each night.  Risks of untreated sleep apnea review and education materials provided. Healthy lifestyle habits encouraged. She will follow up in 1 year. She verbalizes understanding and agreement with this plan.    Orders Placed This Encounter  Procedures   For home use only DME continuous positive airway pressure (CPAP)    Mask refitting, please try memory foam/Dreamwear style FFM due to air leaking, Heated Humidity with all supplies as needed    Length of Need:   Lifetime    Patient has OSA or probable OSA:   Yes    Is the patient currently using CPAP in the home:   Yes    Settings:   Other see comments    CPAP supplies needed:   Mask, headgear, cushions, filters, heated tubing and water chamber     No orders of the defined types were placed in this encounter.     Shawnie Dapper, FNP-C 02/08/2023, 2:28 PM [facility] [address] [place], [place] [address] [phone]

## 2023-02-14 DIAGNOSIS — Z1212 Encounter for screening for malignant neoplasm of rectum: Secondary | ICD-10-CM | POA: Diagnosis not present

## 2023-02-14 DIAGNOSIS — R7989 Other specified abnormal findings of blood chemistry: Secondary | ICD-10-CM | POA: Diagnosis not present

## 2023-02-14 DIAGNOSIS — E785 Hyperlipidemia, unspecified: Secondary | ICD-10-CM | POA: Diagnosis not present

## 2023-02-14 DIAGNOSIS — I10 Essential (primary) hypertension: Secondary | ICD-10-CM | POA: Diagnosis not present

## 2023-02-15 DIAGNOSIS — Z20828 Contact with and (suspected) exposure to other viral communicable diseases: Secondary | ICD-10-CM | POA: Diagnosis not present

## 2023-02-21 DIAGNOSIS — Z0000 Encounter for general adult medical examination without abnormal findings: Secondary | ICD-10-CM | POA: Diagnosis not present

## 2023-02-21 DIAGNOSIS — I10 Essential (primary) hypertension: Secondary | ICD-10-CM | POA: Diagnosis not present

## 2023-02-21 DIAGNOSIS — R531 Weakness: Secondary | ICD-10-CM | POA: Diagnosis not present

## 2023-02-21 DIAGNOSIS — R3589 Other polyuria: Secondary | ICD-10-CM | POA: Diagnosis not present

## 2023-02-21 DIAGNOSIS — Z23 Encounter for immunization: Secondary | ICD-10-CM | POA: Diagnosis not present

## 2023-02-25 DIAGNOSIS — G4733 Obstructive sleep apnea (adult) (pediatric): Secondary | ICD-10-CM | POA: Diagnosis not present

## 2023-03-08 DIAGNOSIS — G4733 Obstructive sleep apnea (adult) (pediatric): Secondary | ICD-10-CM | POA: Diagnosis not present

## 2023-03-25 DIAGNOSIS — G4733 Obstructive sleep apnea (adult) (pediatric): Secondary | ICD-10-CM | POA: Diagnosis not present

## 2023-04-21 DIAGNOSIS — G4733 Obstructive sleep apnea (adult) (pediatric): Secondary | ICD-10-CM | POA: Diagnosis not present

## 2023-05-21 DIAGNOSIS — G4733 Obstructive sleep apnea (adult) (pediatric): Secondary | ICD-10-CM | POA: Diagnosis not present

## 2023-05-23 DIAGNOSIS — L570 Actinic keratosis: Secondary | ICD-10-CM | POA: Diagnosis not present

## 2023-05-23 DIAGNOSIS — Z85828 Personal history of other malignant neoplasm of skin: Secondary | ICD-10-CM | POA: Diagnosis not present

## 2023-05-23 DIAGNOSIS — D225 Melanocytic nevi of trunk: Secondary | ICD-10-CM | POA: Diagnosis not present

## 2023-05-23 DIAGNOSIS — L814 Other melanin hyperpigmentation: Secondary | ICD-10-CM | POA: Diagnosis not present

## 2023-06-21 DIAGNOSIS — G4733 Obstructive sleep apnea (adult) (pediatric): Secondary | ICD-10-CM | POA: Diagnosis not present

## 2023-07-13 ENCOUNTER — Encounter

## 2023-08-10 ENCOUNTER — Other Ambulatory Visit: Payer: Self-pay | Admitting: Medical Genetics

## 2023-08-15 ENCOUNTER — Other Ambulatory Visit: Payer: Self-pay

## 2023-08-16 ENCOUNTER — Ambulatory Visit
Admission: RE | Admit: 2023-08-16 | Discharge: 2023-08-16 | Disposition: A | Discharge status: Home / Self Care | Source: Ambulatory Visit | Attending: Obstetrics and Gynecology | Admitting: Obstetrics and Gynecology

## 2023-08-16 DIAGNOSIS — R928 Other abnormal and inconclusive findings on diagnostic imaging of breast: Secondary | ICD-10-CM | POA: Diagnosis not present

## 2023-08-16 DIAGNOSIS — R920 Mammographic microcalcification found on diagnostic imaging of breast: Secondary | ICD-10-CM | POA: Diagnosis not present

## 2023-08-16 DIAGNOSIS — R921 Mammographic calcification found on diagnostic imaging of breast: Secondary | ICD-10-CM

## 2023-09-06 DIAGNOSIS — G4733 Obstructive sleep apnea (adult) (pediatric): Secondary | ICD-10-CM | POA: Diagnosis not present

## 2023-09-26 ENCOUNTER — Other Ambulatory Visit: Payer: Self-pay | Admitting: Family Medicine

## 2023-09-26 DIAGNOSIS — R29810 Facial weakness: Secondary | ICD-10-CM

## 2023-09-30 ENCOUNTER — Ambulatory Visit
Admission: RE | Admit: 2023-09-30 | Discharge: 2023-09-30 | Disposition: A | Discharge status: Home / Self Care | Source: Ambulatory Visit | Attending: Family Medicine | Admitting: Family Medicine

## 2023-09-30 DIAGNOSIS — R29810 Facial weakness: Secondary | ICD-10-CM

## 2023-10-07 DIAGNOSIS — G4733 Obstructive sleep apnea (adult) (pediatric): Secondary | ICD-10-CM | POA: Diagnosis not present

## 2023-11-07 ENCOUNTER — Other Ambulatory Visit: Payer: Self-pay | Admitting: Medical Genetics

## 2023-11-07 DIAGNOSIS — Z006 Encounter for examination for normal comparison and control in clinical research program: Secondary | ICD-10-CM

## 2023-11-29 DIAGNOSIS — M545 Low back pain, unspecified: Secondary | ICD-10-CM | POA: Diagnosis not present

## 2023-11-29 DIAGNOSIS — M542 Cervicalgia: Secondary | ICD-10-CM | POA: Diagnosis not present

## 2023-12-05 DIAGNOSIS — G4733 Obstructive sleep apnea (adult) (pediatric): Secondary | ICD-10-CM | POA: Diagnosis not present

## 2023-12-13 ENCOUNTER — Other Ambulatory Visit: Payer: Self-pay | Admitting: Internal Medicine

## 2023-12-13 ENCOUNTER — Ambulatory Visit
Admission: RE | Admit: 2023-12-13 | Discharge: 2023-12-13 | Disposition: A | Source: Ambulatory Visit | Attending: Internal Medicine | Admitting: Internal Medicine

## 2023-12-13 DIAGNOSIS — R41844 Frontal lobe and executive function deficit: Secondary | ICD-10-CM

## 2023-12-13 DIAGNOSIS — R29810 Facial weakness: Secondary | ICD-10-CM | POA: Diagnosis not present

## 2023-12-13 MED ORDER — GADOPICLENOL 0.5 MMOL/ML IV SOLN
7.5000 mL | Freq: Once | INTRAVENOUS | Status: AC | PRN
  Administered 2023-12-13: 7.5 mL via INTRAVENOUS

## 2024-01-09 DIAGNOSIS — Z6828 Body mass index (BMI) 28.0-28.9, adult: Secondary | ICD-10-CM | POA: Diagnosis not present

## 2024-01-09 DIAGNOSIS — Z01419 Encounter for gynecological examination (general) (routine) without abnormal findings: Secondary | ICD-10-CM | POA: Diagnosis not present

## 2024-02-08 ENCOUNTER — Ambulatory Visit: Payer: BC Managed Care – PPO | Admitting: Family Medicine

## 2024-03-05 ENCOUNTER — Ambulatory Visit: Admitting: Neurology
